# Patient Record
Sex: Male | Born: 1950 | Race: White | Hispanic: No | Smoking: Never smoker
Health system: Southern US, Community
[De-identification: ages and names within clinical notes are randomized; demographics above are authoritative.]

## PROBLEM LIST (undated history)

## (undated) DIAGNOSIS — I2510 Atherosclerotic heart disease of native coronary artery without angina pectoris: Secondary | ICD-10-CM

## (undated) DIAGNOSIS — R079 Chest pain, unspecified: Secondary | ICD-10-CM

## (undated) DIAGNOSIS — N289 Disorder of kidney and ureter, unspecified: Secondary | ICD-10-CM

## (undated) HISTORY — PX: CARDIAC CATHETERIZATION: SHX172

## (undated) HISTORY — PX: SPHENOIDECTOMY: SHX2421

## (undated) HISTORY — DX: Chest pain, unspecified: R07.9

## (undated) HISTORY — DX: Atherosclerotic heart disease of native coronary artery without angina pectoris: I25.10

---

## 2017-01-05 ENCOUNTER — Emergency Department (HOSPITAL_COMMUNITY)
Admission: EM | Admit: 2017-01-05 | Discharge: 2017-01-05 | Disposition: A | Discharge status: Home / Self Care | Payer: Medicare Other | Attending: Emergency Medicine | Admitting: Emergency Medicine

## 2017-01-05 ENCOUNTER — Encounter (HOSPITAL_COMMUNITY): Payer: Self-pay | Admitting: *Deleted

## 2017-01-05 ENCOUNTER — Emergency Department (HOSPITAL_COMMUNITY): Payer: Medicare Other

## 2017-01-05 DIAGNOSIS — R072 Precordial pain: Secondary | ICD-10-CM | POA: Diagnosis present

## 2017-01-05 DIAGNOSIS — Z7982 Long term (current) use of aspirin: Secondary | ICD-10-CM | POA: Diagnosis not present

## 2017-01-05 DIAGNOSIS — R079 Chest pain, unspecified: Secondary | ICD-10-CM

## 2017-01-05 DIAGNOSIS — I200 Unstable angina: Secondary | ICD-10-CM | POA: Insufficient documentation

## 2017-01-05 HISTORY — DX: Disorder of kidney and ureter, unspecified: N28.9

## 2017-01-05 LAB — BASIC METABOLIC PANEL
ANION GAP: 9 (ref 5–15)
BUN: 12 mg/dL (ref 6–20)
CALCIUM: 9.8 mg/dL (ref 8.9–10.3)
CHLORIDE: 107 mmol/L (ref 101–111)
CO2: 23 mmol/L (ref 22–32)
Creatinine, Ser: 0.99 mg/dL (ref 0.61–1.24)
GFR calc non Af Amer: >60 mL/min (ref >60)
GLUCOSE: 117 mg/dL — AB (ref 65–99)
POTASSIUM: 3.9 mmol/L (ref 3.5–5.1)
Sodium: 139 mmol/L (ref 135–145)

## 2017-01-05 LAB — I-STAT TROPONIN, ED
TROPONIN I, POC: 0.05 ng/mL (ref 0.00–0.08)
Troponin i, poc: 0.07 ng/mL (ref 0.00–0.08)

## 2017-01-05 LAB — CBC
HEMATOCRIT: 44.1 % (ref 39.0–52.0)
HEMOGLOBIN: 14.9 g/dL (ref 13.0–17.0)
MCH: 31.1 pg (ref 26.0–34.0)
MCHC: 33.8 g/dL (ref 30.0–36.0)
MCV: 92.1 fL (ref 78.0–100.0)
Platelets: 209 10*3/uL (ref 150–400)
RBC: 4.79 MIL/uL (ref 4.22–5.81)
RDW: 12.9 % (ref 11.5–15.5)
WBC: 6.3 10*3/uL (ref 4.0–10.5)

## 2017-01-05 MED ORDER — CARVEDILOL 6.25 MG PO TABS: 6.2500 mg | ORAL_TABLET | Freq: Two times a day (BID) | ORAL | 0 refills | Status: DC

## 2017-01-05 MED ORDER — ATORVASTATIN CALCIUM 40 MG PO TABS: 40.0000 mg | ORAL_TABLET | Freq: Every day | ORAL | 0 refills | Status: DC

## 2017-01-05 MED ORDER — NITROGLYCERIN 0.4 MG SL SUBL: 0.4000 mg | SUBLINGUAL_TABLET | SUBLINGUAL | 0 refills | Status: DC | PRN

## 2017-01-05 NOTE — Consult Note (Signed)
Chief Complaint/Reason for Consult: chest pain   Requesting Physician: ED   PCP:  No primary care provider on file. Primary Cardiologist:N/A  HPI:   66 YO male with history of HLD, HTN in town for his sister's funeral with chest pain. Originally from [place], lives now in [place].    He has noticed progressive chest pressure with exertion over the past 2 weeks which has been progressive and resolved with rest.  He previously saw urgent care for this and was referred to [facility] Roderic Ovens Al-Khori) who has set him up for an outpatient exercise treadmill stress.  Today, he was "discussing a sensitive subject" with his family and then while having a bowel movement and straining had chest pain, diaphoresis, and radiation down his left arm.  He took 2 325 ASA and presented to the ED.  ECG abnormal with ST depressions V3.  1st troponin negative.  No shortness of breath.  Previous smoker.  No significant ETOH use.  No early family history of CAD.  Previously took Lipitor (lipids added-on, non-fasting).  Denies other significant PMHx other than psoriasis.  Born with 1 kidney.   Past Medical History:  Diagnosis Date  . Renal disorder    born with 1 funcitoning kidney    Past Surgical History:  Procedure Laterality Date  . SPHENOIDECTOMY     No early CAD  Social History:  reports that he last smoked as a teenager. He has never used smokeless tobacco. He reports that he does not drink alcohol.   Allergies:  Allergies  Allergen Reactions  . Halibut Liver Oil [Fish Oil] Other (See Comments)    Halibut fish causes runny nose  . Shrimp [Shellfish Allergy] Other (See Comments)    Causes runny nose    MEDS: 81 mg ASA  Results for orders placed or performed during the hospital encounter of 01/05/17 (from the past 48 hour(s))  Basic metabolic panel     Status: Abnormal   Collection Time: 01/05/17  3:02 PM  Result Value Ref Range   Sodium 139 135 - 145 mmol/L   Potassium 3.9 3.5 - 5.1 mmol/L   Chloride 107 101 - 111 mmol/L   CO2 23 22 - 32 mmol/L   Glucose, Bld 117 (H) 65 - 99 mg/dL   BUN 12 6 - 20 mg/dL   Creatinine, Ser 0.99 0.61 - 1.24 mg/dL   Calcium 9.8 8.9 - 10.3 mg/dL   GFR calc non Af Amer >60 >60 mL/min   GFR calc Af Amer >60 >60 mL/min    Comment: (NOTE) The eGFR has been calculated using the CKD EPI equation. This calculation has not been validated in all clinical situations. eGFR's persistently <60 mL/min signify possible Chronic Kidney Disease.    Anion gap 9 5 - 15  CBC     Status: None   Collection Time: 01/05/17  3:02 PM  Result Value Ref Range   WBC 6.3 4.0 - 10.5 K/uL   RBC 4.79 4.22 - 5.81 MIL/uL   Hemoglobin 14.9 13.0 - 17.0 g/dL   HCT 44.1 39.0 - 52.0 %   MCV 92.1 78.0 - 100.0 fL   MCH 31.1 26.0 - 34.0 pg   MCHC 33.8 30.0 - 36.0 g/dL   RDW 12.9 11.5 - 15.5 %   Platelets 209 150 - 400 K/uL  I-stat troponin, ED     Status: None   Collection Time: 01/05/17  3:17 PM  Result Value Ref Range   Troponin  i, poc 0.05 0.00 - 0.08 ng/mL   Comment 3            Comment: Due to the release kinetics of cTnI, a negative result within the first hours of the onset of symptoms does not rule out myocardial infarction with certainty. If myocardial infarction is still suspected, repeat the test at appropriate intervals.    Dg Chest 2 View  Result Date: 01/05/2017 CLINICAL DATA:  Shortness of breath for 3 weeks. Midsternal chest pain earlier today with left arm pain and tightness. EXAM: CHEST  2 VIEW COMPARISON:  None. FINDINGS: The heart size and mediastinal contours are normal. The lungs are clear. There is no pleural effusion or pneumothorax. No acute osseous findings are identified. EKG snaps overlie the chest. IMPRESSION: No active cardiopulmonary process. Electronically Signed   By: Richardean Sale M.D.   On: 01/05/2017 15:40    ECG/TELE: NSR with subtle ST changes V3, V4  ROS: As above. Otherwise, review of systems is  negative unless per above HPI  Vitals:   01/05/17 1751 01/05/17 1800 01/05/17 1830 01/05/17 1900  BP: 147/82 174/83  172/84  Pulse: 74 86 75 78  Resp: _0 Temp:      TempSrc:      SpO2: 98% 98% 97% 98%  Weight:      Height:       Wt Readings from Last 10 Encounters:  01/05/17 99.8 kg (220 lb)    PE:  General: No acute distress HEENT: Atraumatic, EOMI, mucous membranes moist CV: RRR 2/6 SEM, gallops. No JVD at 45 degrees. No HJR. Respiratory: Clear, no crackles. Normal work of breathing ABD: Non-distended and non-tender. No palpable organomegaly.  Extremities: 2+ radial pulses bilaterally. No edema. Neuro/Psych: CN grossly intact, alert and oriented  Assessment/Plan Chest Pain, progressive stable angina with episode at rest after emotional stress, concerning for unstable features with ECG changes HTN HLD Systolic Ejection Murmur  Advised patient on recommendations for overnight observation admission with lab draws and consideration for ischemic evaluation in the morning.  Tomorrow is a celebration of life for his sister and he is opposed to staying.  He agreed to another blood draw and if troponin is elevated he agrees to stay.  He already has an outpatient exercise treadmill through [facility] but he would like to transfer his care to [facility].    Recommendations: - Overnight observation admission with serial troponin - TTE - Aspirin 81 mg daily, Coreg 6.25 mg BID, Atorvastatin 40 mg daily, Sublingual Nitroglycerine - If patient leaves, I will inform morning cardiology team of need for urgent clinic visit with consideration for ischemic evaluation.   - Patient knows strict return precautions if recurrent angina or symptoms - Discussed case with ED MD  Patient contact information : [phone] [facility]): [phone]  Lolita Cram Means  MD 01/05/2017, 7:56 PM

## 2017-01-05 NOTE — ED Notes (Signed)
Pt and femal visitor verbaloize understanding of d/c instructions including new prescriptions and PRN usage of nitro. Pt also understands to return with any new CP

## 2017-01-05 NOTE — ED Notes (Signed)
MD at bedside. 

## 2017-01-05 NOTE — ED Triage Notes (Addendum)
PT states intermittent sob and chest pain on exertion  for 2 weeks, each episode lasting 3 min.  However, today episode lasted for 20 min and radiates to L arm.  Pt swallowed 2 325 mg asa and then chewed 1 325 asa.

## 2017-01-05 NOTE — ED Notes (Signed)
Cardiology at bedside.

## 2017-01-05 NOTE — ED Provider Notes (Signed)
[facility]-EMERGENCY DEPT Provider Note   CSN: [id number] Arrival date & time: 01/05/17  1454     History   Chief Complaint Chief Complaint  Patient presents with  . Chest Pain  . Shortness of Breath    HPI [name] is a 66 y.o. male.  The history is provided by the patient.  Chest Pain   This is a new problem. The current episode started more than 1 week ago. The problem occurs every several days. The problem has been gradually worsening. The pain is associated with exertion. The pain is present in the substernal region. The pain is mild. The quality of the pain is described as exertional and pressure-like. The pain radiates to the left arm. Duration of episode(s) is 20 minutes. The symptoms are aggravated by exertion. Associated symptoms include diaphoresis, palpitations and shortness of breath. Pertinent negatives include no abdominal pain, no back pain, no cough, no fever and no vomiting. He has tried rest for the symptoms. The treatment provided mild relief. Risk factors include male gender, stress and obesity.  Pertinent negatives for past medical history include no seizures.    Past Medical History:  Diagnosis Date  . Renal disorder    born with 1 funcitoning kidney    There are no active problems to display for this patient.   Past Surgical History:  Procedure Laterality Date  . SPHENOIDECTOMY         Home Medications    Prior to Admission medications   Medication Sig Start Date End Date Taking? Authorizing Provider  aspirin EC 81 MG tablet Take 81 mg by mouth daily.   Yes Historical Provider, MD  cetirizine (ZYRTEC) 10 MG tablet Take 10 mg by mouth daily.   Yes Historical Provider, MD  Cholecalciferol (VITAMIN D-3) 5000 units TABS Take 1,500 Units by mouth daily.   Yes Historical Provider, MD  Multiple Vitamin (MULTIVITAMIN WITH MINERALS) TABS tablet Take 1 tablet by mouth daily.   Yes Historical Provider, MD  naproxen sodium (ALEVE) 220 MG tablet  Take 220 mg by mouth daily as needed (knee pain).   Yes Historical Provider, MD  Omega-3 Fatty Acids (FISH OIL TRIPLE STRENGTH) 1400 MG CAPS Take 1,400 mg by mouth daily.   Yes Historical Provider, MD  atorvastatin (LIPITOR) 40 MG tablet Take 1 tablet (40 mg total) by mouth daily at 6 PM. 01/05/17 02/04/17  Lennette Bihari, MD  carvedilol (COREG) 6.25 MG tablet Take 1 tablet (6.25 mg total) by mouth 2 (two) times daily with a meal. 01/05/17 02/04/17  Lennette Bihari, MD  nitroGLYCERIN (NITROSTAT) 0.4 MG SL tablet Place 1 tablet (0.4 mg total) under the tongue every 5 (five) minutes as needed for chest pain. 01/05/17   Lennette Bihari, MD    Family History No family history on file.  Social History Social History  Substance Use Topics  . Smoking status: Never Smoker  . Smokeless tobacco: Never Used  . Alcohol use No     Allergies   Halibut liver oil [fish oil] and Shrimp [shellfish allergy]   Review of Systems Review of Systems  Constitutional: Positive for diaphoresis. Negative for chills and fever.  HENT: Negative for ear pain and sore throat.   Eyes: Negative for pain and visual disturbance.  Respiratory: Positive for shortness of breath. Negative for cough.   Cardiovascular: Positive for chest pain and palpitations.  Gastrointestinal: Negative for abdominal pain and vomiting.  Genitourinary: Negative for dysuria and hematuria.  Musculoskeletal: Negative for  arthralgias and back pain.  Skin: Negative for color change and rash.  Neurological: Negative for seizures and syncope.  All other systems reviewed and are negative.    Physical Exam Updated Vital Signs BP 141/93   Pulse 70   Temp 99.3 F (37.4 C) (Oral)   Resp 11   Ht 6\' 2"  (1.88 m)   Wt 99.8 kg   SpO2 97%   BMI 28.25 kg/m   Physical Exam  Constitutional: He appears well-developed and well-nourished.  HENT:  Head: Normocephalic and atraumatic.  Eyes: Conjunctivae are normal.  Neck: Neck supple.    Cardiovascular: Normal rate and regular rhythm.  Exam reveals no gallop and no friction rub.   No murmur heard. Pulmonary/Chest: Effort normal and breath sounds normal. No respiratory distress. He has no wheezes. He has no rales.  Abdominal: Soft. There is no tenderness.  Musculoskeletal: He exhibits no edema, tenderness or deformity.  Neurological: He is alert.  Skin: Skin is warm and dry.  Psychiatric: He has a normal mood and affect.  Nursing note and vitals reviewed.    ED Treatments / Results  Labs (all labs ordered are listed, but only abnormal results are displayed) Labs Reviewed  BASIC METABOLIC PANEL - Abnormal; Notable for the following:       Result Value   Glucose, Bld 117 (*)    All other components within normal limits  CBC  LIPID PANEL  TSH  HEMOGLOBIN A1C  I-STAT TROPOININ, ED  Rosezena SensorI-STAT TROPOININ, ED    EKG  EKG Interpretation None       Radiology Dg Chest 2 View  Result Date: 01/05/2017 CLINICAL DATA:  Shortness of breath for 3 weeks. Midsternal chest pain earlier today with left arm pain and tightness. EXAM: CHEST  2 VIEW COMPARISON:  None. FINDINGS: The heart size and mediastinal contours are normal. The lungs are clear. There is no pleural effusion or pneumothorax. No acute osseous findings are identified. EKG snaps overlie the chest. IMPRESSION: No active cardiopulmonary process. Electronically Signed   By: Carey BullocksWilliam  Veazey M.D.   On: 01/05/2017 15:40    Procedures Procedures (including critical care time)  Medications Ordered in ED Medications - No data to display   Initial Impression / Assessment and Plan / ED Course  I have reviewed the triage vital signs and the nursing notes.  Pertinent labs & imaging results that were available during my care of the patient were reviewed by me and considered in my medical decision making (see chart for details).    Patient is a 66 year old male with medical history, although he has not seen a physician  in many years and thinks he may have high cholesterol, who presents with chest pain. [place] and has been here since january. He is in the process of closing out his sister's estate. He has noticed several episodes of exertional chest pain over the last few weeks that are becoming more frequent and lasting longer. Initially, his episodes would resolve immediately upon resting. They were infrequent and unpredictable. However, over the last 2 days, he has had pain with mild exertion which lasted 10-20 minutes after resting. Today, he had an episode several hours prior to arrival which was also associated with diaphoresis and shortness of breath. After one of his recent episodes, he saw a local cardiologist who has scheduled him for a stress test on 01/16/2017. However, patient states he would rather see cardiology here. He is pain-free in the ED. He  took aspirin prior to arrival. His EKG is notable for ST depression in V2 through V4. Initial troponin negative. We recommended inpatient workup of his chest pain. However, patient states that his sister's memorial services tomorrow and he is not willing to stay in the hospital tonight given that his pain is resolved. We consulted her cardiologist is seen the patient in the ED. They will attempt to arrange for close outpatient follow-up and likely stress testing. Patient is able to express the risks of leaving without further workup. He did agree to stay for delta troponin which was negative. He remained pain-free throughout his time in the ED. Return precautions were discussed in detail. He was started on aspirin, Lipitor, Coreg, and sublingual nitroglycerin. Patient discharged in stable condition.  Final Clinical Impressions(s) / ED Diagnoses   Final diagnoses:  Unstable angina [facility])    New Prescriptions Discharge Medication List as of 01/05/2017  9:12 PM    START taking these medications   Details  atorvastatin (LIPITOR) 40 MG  tablet Take 1 tablet (40 mg total) by mouth daily at 6 PM., Starting Sat 01/05/2017, Until Mon 02/04/2017, Print    carvedilol (COREG) 6.25 MG tablet Take 1 tablet (6.25 mg total) by mouth 2 (two) times daily with a meal., Starting Sat 01/05/2017, Until Mon 02/04/2017, Print    nitroGLYCERIN (NITROSTAT) 0.4 MG SL tablet Place 1 tablet (0.4 mg total) under the tongue every 5 (five) minutes as needed for chest pain., Starting Sat 01/05/2017, Print         Lennette Bihari, MD 01/06/17 5638    Geoffery Lyons, MD 01/06/17 1537

## 2017-01-07 ENCOUNTER — Encounter: Payer: Self-pay | Admitting: Cardiology

## 2017-01-08 ENCOUNTER — Encounter: Payer: Self-pay | Admitting: *Deleted

## 2017-01-08 ENCOUNTER — Encounter: Payer: Self-pay | Admitting: Cardiology

## 2017-01-08 ENCOUNTER — Encounter (INDEPENDENT_AMBULATORY_CARE_PROVIDER_SITE_OTHER): Payer: Self-pay

## 2017-01-08 ENCOUNTER — Ambulatory Visit (INDEPENDENT_AMBULATORY_CARE_PROVIDER_SITE_OTHER): Payer: Medicare Other | Admitting: Cardiology

## 2017-01-08 VITALS — BP 118/80 | HR 68 | Ht 74.0 in | Wt 220.4 lb

## 2017-01-08 DIAGNOSIS — L409 Psoriasis, unspecified: Secondary | ICD-10-CM

## 2017-01-08 DIAGNOSIS — Z01812 Encounter for preprocedural laboratory examination: Secondary | ICD-10-CM

## 2017-01-08 DIAGNOSIS — I200 Unstable angina: Secondary | ICD-10-CM | POA: Diagnosis not present

## 2017-01-08 DIAGNOSIS — R079 Chest pain, unspecified: Secondary | ICD-10-CM

## 2017-01-08 LAB — BASIC METABOLIC PANEL
BUN/Creatinine Ratio: 13 (ref 10–24)
BUN: 13 mg/dL (ref 8–27)
CO2: 24 mmol/L (ref 18–29)
CREATININE: 0.97 mg/dL (ref 0.76–1.27)
Calcium: 10.3 mg/dL — ABNORMAL HIGH (ref 8.6–10.2)
Chloride: 100 mmol/L (ref 96–106)
GFR calc Af Amer: 93 (ref >59)
GFR, EST NON AFRICAN AMERICAN: 80 (ref >59)
Glucose: 96 mg/dL (ref 65–99)
POTASSIUM: 4.7 mmol/L (ref 3.5–5.2)
SODIUM: 142 mmol/L (ref 134–144)

## 2017-01-08 LAB — PROTIME-INR
INR: 1 (ref 0.8–1.2)
PROTHROMBIN TIME: 10.8 s (ref 9.1–12.0)

## 2017-01-08 LAB — CBC
HEMATOCRIT: 41.7 % (ref 37.5–51.0)
Hemoglobin: 14.2 g/dL (ref 13.0–17.7)
MCH: 30.9 pg (ref 26.6–33.0)
MCHC: 34.1 g/dL (ref 31.5–35.7)
MCV: 91 fL (ref 79–97)
Platelets: 231 10*3/uL (ref 150–379)
RBC: 4.6 x10E6/uL (ref 4.14–5.80)
RDW: 13.4 % (ref 12.3–15.4)
WBC: 7.3 10*3/uL (ref 3.4–10.8)

## 2017-01-08 NOTE — Patient Instructions (Signed)
Medication Instructions:  The current medical regimen is effective;  continue present plan and medications.  Labwork: Please have blood work today (BMP, CBC and INR/PT)  Testing/Procedures: Your physician has requested that you have a cardiac catheterization. Cardiac catheterization is used to diagnose and/or treat various heart conditions. Doctors may recommend this procedure for a number of different reasons. The most common reason is to evaluate chest pain. Chest pain can be a symptom of coronary artery disease (CAD), and cardiac catheterization can show whether plaque is narrowing or blocking your heart's arteries. This procedure is also used to evaluate the valves, as well as measure the blood flow and oxygen levels in different parts of your heart. For further information please visit [email or web address]. Please follow instruction sheet, as given.  Follow-Up: Follow up approximately 2 weeks after your cardiac cath.  Thank you for choosing [facility]!!

## 2017-01-08 NOTE — Progress Notes (Signed)
 Cardiology Office Note    Date:  01/08/2017   ID:  [name], DOB 10/08/1951, MRN [id number]  PCP:  No PCP Per Patient  Cardiologist:   Lenford Beddow, MD     History of Present Illness:  [name] is a 65 y.o. male here for evaluation of chest pain. Seen by Dr. Means who recommended staying however he decided to leave.  To quote Dr. Means: 65 year old male with history of HLD, HTN in town for his sister's funeral with chest pain. Originally from [place], lives now in [place]    He has noticed progressive chest pressure with exertion over the past 2 weeks which has been progressive and resolved with rest.  He previously saw urgent care for this and was referred to [facility] on 12/25/16 (Dr. Fareed Al-Khori) who has set him up for an outpatient exercise treadmill stress.  Today, he was "discussing a sensitive subject" with his family and then while having a bowel movement and straining had chest pain, diaphoresis, and radiation down his left arm.  He took 2 325 ASA and presented to the ED.  ECG abnormal with ST depressions V3.  1st troponin negative.  No shortness of breath.  Previous smoker.  No significant ETOH use.  No early family history of CAD.  Previously took Lipitor (lipids added-on, non-fasting).  Denies other significant PMHx other than psoriasis.  Born with 1 kidney.   Prior chest pain was mainly with exertion, lifting heavy objects, pushing heavy carts.   He had stress test in 1990's and 2004 - normal.   Yesterday he states he did not have any chest pain and on Sunday they celebrated his sister's life. His sister died in August 2017 after car accident. He moved down here from [place] to help settle her estate. 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 On 12/11/16 when lifting a heavy box, he felt a funny feeling in his chest. He's felt a proximally doesn't times since then. Bridget is his significant other who is in  [place]. He also has a daughter.  The most concerning episode happened a few days ago when [place] when he began to feel shortness of breath, significant chest pain, left arm pain and diaphoresis that lasted approximate 20 minutes relieved with rest. He said other episodes that are similar to this but less intense.   Oct. Fishing [place] fishing, mild altitude related SOB. No CP.   No DM, No CAD. Mother died 99 CHF. Bb, statin just started. No physical exam in 7 years.   Past Medical History:  Diagnosis Date  . Renal disorder    born with 1 funcitoning kidney    Past Surgical History:  Procedure Laterality Date  . SPHENOIDECTOMY      Current Medications: Outpatient Medications Prior to Visit  Medication Sig Dispense Refill  . aspirin EC 81 MG tablet Take 81 mg by mouth daily.    . atorvastatin (LIPITOR) 40 MG tablet Take 1 tablet (40 mg total) by mouth daily at 6 PM. 30 tablet 0  . carvedilol (COREG) 6.25 MG tablet Take 1 tablet (6.25 mg total) by mouth 2 (two) times daily with a meal. 60 tablet 0  . cetirizine (ZYRTEC) 10 MG tablet Take 10 mg by mouth daily.    . Cholecalciferol (VITAMIN D-3) 5000 units TABS Take 1,500 Units by mouth daily.    . Multiple Vitamin (MULTIVITAMIN WITH MINERALS) TABS   tablet Take 1 tablet by mouth daily.    . naproxen sodium (ALEVE) 220 MG tablet Take 220 mg by mouth daily as needed (knee pain).    . nitroGLYCERIN (NITROSTAT) 0.4 MG SL tablet Place 1 tablet (0.4 mg total) under the tongue every 5 (five) minutes as needed for chest pain. 30 tablet 0  . Omega-3 Fatty Acids (FISH OIL TRIPLE STRENGTH) 1400 MG CAPS Take 1,400 mg by mouth daily.     No facility-administered medications prior to visit.      Allergies:   Halibut liver oil [fish oil] and Shrimp [shellfish allergy]   Social History   Social History  . Marital status: Divorced    Spouse name: N/A  . Number of children: N/A  . Years of education: N/A    Social History Main Topics  . Smoking status: Never Smoker  . Smokeless tobacco: Never Used  . Alcohol use No  . Drug use: No  . Sexual activity: Not Asked   Other Topics Concern  . None   Social History Narrative  . None     Family History:  No early CAD   ROS:   Please see the history of present illness.    ROS All other systems reviewed and are negative.   PHYSICAL EXAM:   VS:  BP 118/80 (BP Location: Right Arm)   Pulse 68   Ht 6' 2" (1.88 m)   Wt 220 lb 6.4 oz (100 kg)   BMI 28.30 kg/m    GEN: Well nourished, well developed, in no acute distress  HEENT: normal  Neck: no JVD, carotid bruits, or masses Cardiac: RRR; no murmurs, rubs, or gallops,no edema  Respiratory:  clear to auscultation bilaterally, normal work of breathing GI: soft, nontender, nondistended, + BS MS: no deformity or atrophy  Skin: warm and dry, no rash Neuro:  Alert and Oriented x 3, Strength and sensation are intact Psych: euthymic mood, full affect  Wt Readings from Last 3 Encounters:  01/08/17 220 lb 6.4 oz (100 kg)  01/05/17 220 lb (99.8 kg)      Studies/Labs Reviewed:   EKG:  01/05/17 - NSR with very subtle ST depression (NSSTW changes) V3-4, Personally viewed. EKG from 12/12/16 shows heart rate 90, PACs, nonspecific ST-T wave changes.  Recent Labs: 01/05/2017: BUN 12; Creatinine, Ser 0.99; Hemoglobin 14.9; Platelets 209; Potassium 3.9; Sodium 139   Lipid Panel No results found for: CHOL, TRIG, HDL, CHOLHDL, VLDL, LDLCALC, LDLDIRECT  Additional studies/ records that were reviewed today include:  CXR - WNL Trop normal ER visit notes reviewed.    ASSESSMENT:    1. Unstable angina (HCC)   2. Chest pain, unspecified type   3. Pre-procedure lab exam   4. Psoriasis      PLAN:  In order of problems listed above:  Unstable angina  - ER visit 01/05/17 - trop x 2 normal, ECG with NSSTW changes  - No DM, no FHX, no prior CAD  - He has had multiple episodes that seem to be  escalating, increasing. [place]st episode [place] developing central chest pain with left arm radiation and diaphoresis that relieved itself after 20 minutes. Concerning symptom. He is worried about this.  - We'll proceed with left heart catheterization, radial artery approach. Risks of stroke, MI, death, renal impairment, bleeding discussed. Normal creatinine 0.99, solitary kidney, hydration. He has a shellfish allergy listed however he states that he has "nasal discharge ", runny nose with   this and has never had any trouble breathing or hives or rash.  - Continue with beta blocker and statin for now.  Multiple allergies  - list reviewed  - We discussed shrimp allergy - he said that he only develops a runny nose occasionally when eating shrimp. He has never developed any trouble breathing, no hives or any anaphylactic reaction. I asked him to take a Benadryl and a Pepcid the morning of the procedure. I will not prescribe prednisone prior.  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 Creatinine 0.9    Medication Adjustments/Labs and Tests Ordered: Current medicines are reviewed at length with the patient today.  Concerns regarding medicines are outlined above.  Medication changes, Labs and Tests ordered today are listed in the Patient Instructions below. Patient Instructions  Medication Instructions:  The current medical regimen is effective;  continue present plan and medications.  Labwork: Please have blood work today (BMP, CBC and INR/PT)  Testing/Procedures: Your physician has requested that you have a cardiac catheterization. Cardiac catheterization is used to diagnose and/or treat various heart conditions. Doctors may recommend this procedure for a number of different reasons. The most common reason is to evaluate chest pain. Chest pain can be a symptom of coronary artery disease (CAD), and cardiac catheterization  can show whether plaque is narrowing or blocking your heart's arteries. This procedure is also used to evaluate the valves, as well as measure the blood flow and oxygen levels in different parts of your heart. For further information please visit www.[email or web address]. Please follow instruction sheet, as given.  Follow-Up: Follow up approximately 2 weeks after your cardiac cath.  Thank you for choosing [facility]!!         Signed, Alyson Ki, MD  01/08/2017 9:14 AM    [facility] [address], [place], [place]  [address] Phone: [phone]; Fax: [phone]  

## 2017-01-09 ENCOUNTER — Encounter (HOSPITAL_COMMUNITY)
Admission: RE | Disposition: A | Discharge status: Home / Self Care | Payer: Self-pay | Source: Ambulatory Visit | Attending: Cardiothoracic Surgery

## 2017-01-09 ENCOUNTER — Other Ambulatory Visit: Payer: Self-pay | Admitting: *Deleted

## 2017-01-09 ENCOUNTER — Encounter (HOSPITAL_COMMUNITY): Payer: Self-pay | Admitting: Interventional Cardiology

## 2017-01-09 ENCOUNTER — Inpatient Hospital Stay (HOSPITAL_COMMUNITY)
Admission: RE | Admit: 2017-01-09 | Discharge: 2017-01-16 | DRG: 234 | Disposition: A | Discharge status: Home / Self Care | Payer: Medicare Other | Source: Ambulatory Visit | Attending: Cardiothoracic Surgery | Admitting: Cardiothoracic Surgery

## 2017-01-09 ENCOUNTER — Encounter (HOSPITAL_COMMUNITY): Payer: Medicare Other

## 2017-01-09 ENCOUNTER — Inpatient Hospital Stay (HOSPITAL_COMMUNITY): Payer: Medicare Other

## 2017-01-09 DIAGNOSIS — I2510 Atherosclerotic heart disease of native coronary artery without angina pectoris: Secondary | ICD-10-CM

## 2017-01-09 DIAGNOSIS — Z7982 Long term (current) use of aspirin: Secondary | ICD-10-CM | POA: Diagnosis not present

## 2017-01-09 DIAGNOSIS — E559 Vitamin D deficiency, unspecified: Secondary | ICD-10-CM | POA: Diagnosis present

## 2017-01-09 DIAGNOSIS — Q600 Renal agenesis, unilateral: Secondary | ICD-10-CM

## 2017-01-09 DIAGNOSIS — Z951 Presence of aortocoronary bypass graft: Secondary | ICD-10-CM

## 2017-01-09 DIAGNOSIS — Z79899 Other long term (current) drug therapy: Secondary | ICD-10-CM

## 2017-01-09 DIAGNOSIS — D696 Thrombocytopenia, unspecified: Secondary | ICD-10-CM | POA: Diagnosis not present

## 2017-01-09 DIAGNOSIS — E785 Hyperlipidemia, unspecified: Secondary | ICD-10-CM

## 2017-01-09 DIAGNOSIS — L409 Psoriasis, unspecified: Secondary | ICD-10-CM | POA: Diagnosis present

## 2017-01-09 DIAGNOSIS — I249 Acute ischemic heart disease, unspecified: Secondary | ICD-10-CM | POA: Diagnosis present

## 2017-01-09 DIAGNOSIS — I10 Essential (primary) hypertension: Secondary | ICD-10-CM | POA: Diagnosis present

## 2017-01-09 DIAGNOSIS — I200 Unstable angina: Secondary | ICD-10-CM

## 2017-01-09 DIAGNOSIS — I25700 Atherosclerosis of coronary artery bypass graft(s), unspecified, with unstable angina pectoris: Secondary | ICD-10-CM

## 2017-01-09 DIAGNOSIS — I25110 Atherosclerotic heart disease of native coronary artery with unstable angina pectoris: Secondary | ICD-10-CM | POA: Diagnosis present

## 2017-01-09 DIAGNOSIS — R079 Chest pain, unspecified: Secondary | ICD-10-CM

## 2017-01-09 DIAGNOSIS — D62 Acute posthemorrhagic anemia: Secondary | ICD-10-CM | POA: Diagnosis not present

## 2017-01-09 DIAGNOSIS — N400 Enlarged prostate without lower urinary tract symptoms: Secondary | ICD-10-CM | POA: Diagnosis present

## 2017-01-09 DIAGNOSIS — Z09 Encounter for follow-up examination after completed treatment for conditions other than malignant neoplasm: Secondary | ICD-10-CM

## 2017-01-09 DIAGNOSIS — R7989 Other specified abnormal findings of blood chemistry: Secondary | ICD-10-CM

## 2017-01-09 DIAGNOSIS — J939 Pneumothorax, unspecified: Secondary | ICD-10-CM

## 2017-01-09 DIAGNOSIS — I471 Supraventricular tachycardia: Secondary | ICD-10-CM | POA: Diagnosis not present

## 2017-01-09 DIAGNOSIS — E8770 Fluid overload, unspecified: Secondary | ICD-10-CM | POA: Diagnosis not present

## 2017-01-09 DIAGNOSIS — Z01810 Encounter for preprocedural cardiovascular examination: Secondary | ICD-10-CM | POA: Diagnosis not present

## 2017-01-09 DIAGNOSIS — Z87438 Personal history of other diseases of male genital organs: Secondary | ICD-10-CM

## 2017-01-09 DIAGNOSIS — N200 Calculus of kidney: Secondary | ICD-10-CM

## 2017-01-09 HISTORY — DX: Chest pain, unspecified: R07.9

## 2017-01-09 HISTORY — PX: LEFT HEART CATH AND CORONARY ANGIOGRAPHY: CATH118249

## 2017-01-09 LAB — PULMONARY FUNCTION TEST
FEF 25-75 Pre: 1.62 L/sec
FEF2575-%Pred-Pre: 54 %
FEV1-%Pred-Pre: 72 %
FEV1-Pre: 2.78 L
FEV1FVC-%Pred-Pre: 93 %
FEV6-%Pred-Pre: 76 %
FEV6-Pre: 3.79 L
FEV6FVC-%Pred-Pre: 99 %
FVC-%Pred-Pre: 77 %
FVC-Pre: 4.03 L
Pre FEV1/FVC ratio: 69 %
Pre FEV6/FVC Ratio: 94 %

## 2017-01-09 SURGERY — LEFT HEART CATH AND CORONARY ANGIOGRAPHY

## 2017-01-09 MED ORDER — NITROGLYCERIN 1 MG/10 ML FOR IR/CATH LAB
INTRA_ARTERIAL | Status: DC | PRN
  Administered 2017-01-09: 200 ug via INTRACORONARY

## 2017-01-09 MED ORDER — HEPARIN SODIUM (PORCINE) 1000 UNIT/ML IJ SOLN
INTRAMUSCULAR | Status: AC
  Filled 2017-01-09: qty 1

## 2017-01-09 MED ORDER — FENTANYL CITRATE (PF) 100 MCG/2ML IJ SOLN
INTRAMUSCULAR | Status: AC
Start: 2017-01-09 — End: 2017-01-09
  Filled 2017-01-09: qty 2

## 2017-01-09 MED ORDER — CARVEDILOL 6.25 MG PO TABS
6.2500 mg | ORAL_TABLET | Freq: Two times a day (BID) | ORAL | Status: DC
  Administered 2017-01-09 – 2017-01-10 (×3): 6.25 mg via ORAL
  Filled 2017-01-09 (×3): qty 1

## 2017-01-09 MED ORDER — ATORVASTATIN CALCIUM 80 MG PO TABS
80.0000 mg | ORAL_TABLET | Freq: Every day | ORAL | Status: DC
  Administered 2017-01-09 – 2017-01-15 (×6): 80 mg via ORAL
  Filled 2017-01-09 (×6): qty 1

## 2017-01-09 MED ORDER — ACETAMINOPHEN 325 MG PO TABS: 650.0000 mg | ORAL_TABLET | ORAL | Status: DC | PRN

## 2017-01-09 MED ORDER — SODIUM CHLORIDE 0.9% FLUSH
3.0000 mL | Freq: Two times a day (BID) | INTRAVENOUS | Status: DC
  Administered 2017-01-09 – 2017-01-10 (×3): 3 mL via INTRAVENOUS

## 2017-01-09 MED ORDER — MIDAZOLAM HCL 2 MG/2ML IJ SOLN
INTRAMUSCULAR | Status: DC | PRN
  Administered 2017-01-09: 1 mg via INTRAVENOUS

## 2017-01-09 MED ORDER — ENOXAPARIN SODIUM 100 MG/ML ~~LOC~~ SOLN
95.0000 mg | Freq: Two times a day (BID) | SUBCUTANEOUS | Status: DC
  Administered 2017-01-09: 95 mg via SUBCUTANEOUS
  Filled 2017-01-09: qty 1

## 2017-01-09 MED ORDER — SODIUM CHLORIDE 0.9% FLUSH: 3.0000 mL | INTRAVENOUS | Status: DC | PRN

## 2017-01-09 MED ORDER — IOPAMIDOL (ISOVUE-370) INJECTION 76%
INTRAVENOUS | Status: AC
  Filled 2017-01-09: qty 100

## 2017-01-09 MED ORDER — IOPAMIDOL (ISOVUE-370) INJECTION 76%
INTRAVENOUS | Status: DC | PRN
  Administered 2017-01-09: 105 mL via INTRA_ARTERIAL

## 2017-01-09 MED ORDER — NITROGLYCERIN 1 MG/10 ML FOR IR/CATH LAB
INTRA_ARTERIAL | Status: AC
  Filled 2017-01-09: qty 10

## 2017-01-09 MED ORDER — LORATADINE 10 MG PO TABS
10.0000 mg | ORAL_TABLET | Freq: Every day | ORAL | Status: DC
  Administered 2017-01-10 – 2017-01-12 (×2): 10 mg via ORAL
  Filled 2017-01-09 (×2): qty 1

## 2017-01-09 MED ORDER — VERAPAMIL HCL 2.5 MG/ML IV SOLN
INTRAVENOUS | Status: AC
  Filled 2017-01-09: qty 2

## 2017-01-09 MED ORDER — OXYCODONE-ACETAMINOPHEN 5-325 MG PO TABS: 1.0000 | ORAL_TABLET | ORAL | Status: DC | PRN

## 2017-01-09 MED ORDER — MIDAZOLAM HCL 2 MG/2ML IJ SOLN
INTRAMUSCULAR | Status: AC
  Filled 2017-01-09: qty 2

## 2017-01-09 MED ORDER — ASPIRIN 81 MG PO CHEW
81.0000 mg | CHEWABLE_TABLET | Freq: Every day | ORAL | Status: DC
  Administered 2017-01-10: 81 mg via ORAL
  Filled 2017-01-09: qty 1

## 2017-01-09 MED ORDER — LIDOCAINE HCL (PF) 1 % IJ SOLN
INTRAMUSCULAR | Status: AC
  Filled 2017-01-09: qty 30

## 2017-01-09 MED ORDER — LIDOCAINE HCL (PF) 1 % IJ SOLN
INTRAMUSCULAR | Status: DC | PRN
  Administered 2017-01-09: 2 mL

## 2017-01-09 MED ORDER — HEPARIN (PORCINE) IN NACL 2-0.9 UNIT/ML-% IJ SOLN
INTRAMUSCULAR | Status: AC
  Filled 2017-01-09: qty 1000

## 2017-01-09 MED ORDER — SODIUM CHLORIDE 0.9% FLUSH
3.0000 mL | Freq: Two times a day (BID) | INTRAVENOUS | Status: DC
  Administered 2017-01-09: 3 mL via INTRAVENOUS

## 2017-01-09 MED ORDER — VITAMIN D-3 125 MCG (5000 UT) PO TABS: 1500.0000 [IU] | ORAL_TABLET | Freq: Every day | ORAL | Status: DC

## 2017-01-09 MED ORDER — SODIUM CHLORIDE 0.9 % WEIGHT BASED INFUSION: 1.0000 mL/kg/h | INTRAVENOUS | Status: DC

## 2017-01-09 MED ORDER — ASPIRIN 81 MG PO CHEW: 81.0000 mg | CHEWABLE_TABLET | ORAL | Status: DC

## 2017-01-09 MED ORDER — ATORVASTATIN CALCIUM 40 MG PO TABS: 40.0000 mg | ORAL_TABLET | Freq: Every day | ORAL | Status: DC

## 2017-01-09 MED ORDER — HEPARIN (PORCINE) IN NACL 2-0.9 UNIT/ML-% IJ SOLN
INTRAMUSCULAR | Status: DC | PRN
  Administered 2017-01-09: 1000 mL

## 2017-01-09 MED ORDER — NITROGLYCERIN 0.4 MG SL SUBL: 0.4000 mg | SUBLINGUAL_TABLET | SUBLINGUAL | Status: DC | PRN

## 2017-01-09 MED ORDER — MORPHINE SULFATE (PF) 2 MG/ML IV SOLN: 2.0000 mg | INTRAVENOUS | Status: DC | PRN

## 2017-01-09 MED ORDER — SODIUM CHLORIDE 0.9 % WEIGHT BASED INFUSION
3.0000 mL/kg/h | INTRAVENOUS | Status: DC
  Administered 2017-01-09: 3 mL/kg/h via INTRAVENOUS

## 2017-01-09 MED ORDER — ONDANSETRON HCL 4 MG/2ML IJ SOLN: 4.0000 mg | Freq: Four times a day (QID) | INTRAMUSCULAR | Status: DC | PRN

## 2017-01-09 MED ORDER — FENTANYL CITRATE (PF) 100 MCG/2ML IJ SOLN
INTRAMUSCULAR | Status: DC | PRN
  Administered 2017-01-09: 50 ug via INTRAVENOUS

## 2017-01-09 MED ORDER — SODIUM CHLORIDE 0.9 % WEIGHT BASED INFUSION
1.0000 mL/kg/h | INTRAVENOUS | Status: AC
  Administered 2017-01-09: 1 mL/kg/h via INTRAVENOUS

## 2017-01-09 MED ORDER — SODIUM CHLORIDE 0.9 % IV SOLN: 250.0000 mL | INTRAVENOUS | Status: DC | PRN

## 2017-01-09 MED ORDER — ADULT MULTIVITAMIN W/MINERALS CH
1.0000 | ORAL_TABLET | Freq: Every day | ORAL | Status: DC
  Administered 2017-01-10 – 2017-01-12 (×2): 1 via ORAL
  Filled 2017-01-09 (×2): qty 1

## 2017-01-09 MED ORDER — SODIUM CHLORIDE 0.9 % IV SOLN
250.0000 mL | INTRAVENOUS | Status: DC | PRN
Start: 2017-01-09 — End: 2017-01-11
  Administered 2017-01-11: 08:00:00 via INTRAVENOUS

## 2017-01-09 MED ORDER — HEPARIN (PORCINE) IN NACL 2-0.9 UNIT/ML-% IJ SOLN
INTRAMUSCULAR | Status: DC | PRN
  Administered 2017-01-09: 12:00:00 via INTRA_ARTERIAL

## 2017-01-09 MED ORDER — ASPIRIN EC 81 MG PO TBEC: 81.0000 mg | DELAYED_RELEASE_TABLET | Freq: Every day | ORAL | Status: DC

## 2017-01-09 MED ORDER — HEPARIN SODIUM (PORCINE) 1000 UNIT/ML IJ SOLN
INTRAMUSCULAR | Status: DC | PRN
  Administered 2017-01-09: 5000 [IU] via INTRAVENOUS

## 2017-01-09 SURGICAL SUPPLY — 12 items
CATH INFINITI 5 FR JL3.5 (CATHETERS) ×3 IMPLANT
CATH INFINITI JR4 5F (CATHETERS) ×3 IMPLANT
CATH LAUNCHER 5F EBU3.5 (CATHETERS) ×3 IMPLANT
DEVICE RAD COMP TR BAND LRG (VASCULAR PRODUCTS) ×3 IMPLANT
GLIDESHEATH SLEND A-KIT 6F 22G (SHEATH) ×3 IMPLANT
GUIDEWIRE INQWIRE 1.5J.035X260 (WIRE) ×1 IMPLANT
INQWIRE 1.5J .035X260CM (WIRE) ×3
KIT ENCORE 26 ADVANTAGE (KITS) IMPLANT
KIT HEART LEFT (KITS) ×3 IMPLANT
PACK CARDIAC CATHETERIZATION (CUSTOM PROCEDURE TRAY) ×3 IMPLANT
TRANSDUCER W/STOPCOCK (MISCELLANEOUS) ×3 IMPLANT
TUBING CIL FLEX 10 FLL-RA (TUBING) ×3 IMPLANT

## 2017-01-09 NOTE — H&P (View-Only) (Signed)
Cardiology Office Note    Date:  01/08/2017   ID:  [name], DOB 12-29-1950, MRN [id number]  PCP:  No PCP Per Patient  Cardiologist:   Tyler SchultzMark Skains, MD     History of Present Illness:  [name] is a 66 y.o. male here for evaluation of chest pain. Seen by Dr. Charlestine Cole who recommended staying however he decided to leave.  To quote Dr. Charlestine Cole: 66 year old male with history of HLD, HTN in town for his sister's funeral with chest pain. Originally from [place], lives now in [place]    He has noticed progressive chest pressure with exertion over the past 2 weeks which has been progressive and resolved with rest.  He previously saw urgent care for this and was referred to [facility] on 12/25/16 (Dr. Vick FreesFareed Cole) who has set him up for an outpatient exercise treadmill stress.  Today, he was "discussing a sensitive subject" with his family and then while having a bowel movement and straining had chest pain, diaphoresis, and radiation down his left arm.  He took 2 325 ASA and presented to the ED.  ECG abnormal with ST depressions V3.  1st troponin negative.  No shortness of breath.  Previous smoker.  No significant ETOH use.  No early family history of CAD.  Previously took Lipitor (lipids added-on, non-fasting).  Denies other significant PMHx other than psoriasis.  Born with 1 kidney.   Prior chest pain was mainly with exertion, lifting heavy objects, pushing heavy carts.   He had stress test in 1990's and 2004 - normal.   Yesterday he states he did not have any chest pain and on Sunday they celebrated his sister's life. His sister died in August 2017 after car accident. He moved down here from [place] to help settle her estate. 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 On 12/11/16 when lifting a heavy box, he felt a funny feeling in his chest. He's felt a proximally doesn't times since then. [name] is his significant other who is in  [place]. He also has a daughter.  The most concerning episode happened a few days ago when [place] when he began to feel shortness of breath, significant chest pain, left arm pain and diaphoresis that lasted approximate 20 minutes relieved with rest. He said other episodes that are similar to this but less intense.   Oct. [place]ishing [place] fishing, mild altitude related SOB. No CP.   No DM, No CAD. Mother died 499 CHF. Bb, statin just started. No physical exam in 7 years.   Past Medical History:  Diagnosis Date  . Renal disorder    born with 1 funcitoning kidney    Past Surgical History:  Procedure Laterality Date  . SPHENOIDECTOMY      Current Medications: Outpatient Medications Prior to Visit  Medication Sig Dispense Refill  . aspirin EC 81 MG tablet Take 81 mg by mouth daily.    Marland Kitchen. atorvastatin (LIPITOR) 40 MG tablet Take 1 tablet (40 mg total) by mouth daily at 6 PM. 30 tablet 0  . carvedilol (COREG) 6.25 MG tablet Take 1 tablet (6.25 mg total) by mouth 2 (two) times daily with a meal. 60 tablet 0  . cetirizine (ZYRTEC) 10 MG tablet Take 10 mg by mouth daily.    . Cholecalciferol (VITAMIN D-3) 5000 units TABS Take 1,500 Units by mouth daily.    . Multiple Vitamin (MULTIVITAMIN WITH MINERALS) TABS  tablet Take 1 tablet by mouth daily.    . naproxen sodium (ALEVE) 220 MG tablet Take 220 mg by mouth daily as needed (knee pain).    . nitroGLYCERIN (NITROSTAT) 0.4 MG SL tablet Place 1 tablet (0.4 mg total) under the tongue every 5 (five) minutes as needed for chest pain. 30 tablet 0  . Omega-3 Fatty Acids (FISH OIL TRIPLE STRENGTH) 1400 MG CAPS Take 1,400 mg by mouth daily.     No facility-administered medications prior to visit.      Allergies:   Halibut liver oil [fish oil] and Shrimp [shellfish allergy]   Social History   Social History  . Marital status: Divorced    Spouse name: N/A  . Number of children: N/A  . Years of education: N/A    Social History Main Topics  . Smoking status: Never Smoker  . Smokeless tobacco: Never Used  . Alcohol use No  . Drug use: No  . Sexual activity: Not Asked   Other Topics Concern  . None   Social History Narrative  . None     Family History:  No early CAD   ROS:   Please see the history of present illness.    ROS All other systems reviewed and are negative.   PHYSICAL EXAM:   VS:  BP 118/80 (BP Location: Right Arm)   Pulse 68   Ht 6\' 2"  (1.88 m)   Wt 220 lb 6.4 oz (100 kg)   BMI 28.30 kg/m    GEN: Well nourished, well developed, in no acute distress  HEENT: normal  Neck: no JVD, carotid bruits, or masses Cardiac: RRR; no murmurs, rubs, or gallops,no edema  Respiratory:  clear to auscultation bilaterally, normal work of breathing GI: soft, nontender, nondistended, + BS MS: no deformity or atrophy  Skin: warm and dry, no rash Neuro:  Alert and Oriented x 3, Strength and sensation are intact Psych: euthymic mood, full affect  Wt Readings from Last 3 Encounters:  01/08/17 220 lb 6.4 oz (100 kg)  01/05/17 220 lb (99.8 kg)      Studies/Labs Reviewed:   EKG:  01/05/17 - NSR with very subtle ST depression (NSSTW changes) V3-4, Personally viewed. EKG from 12/12/16 shows heart rate 90, PACs, nonspecific ST-T wave changes.  Recent Labs: 01/05/2017: BUN 12; Creatinine, Ser 0.99; Hemoglobin 14.9; Platelets 209; Potassium 3.9; Sodium 139   Lipid Panel No results found for: CHOL, TRIG, HDL, CHOLHDL, VLDL, LDLCALC, LDLDIRECT  Additional studies/ records that were reviewed today include:  CXR - WNL Trop normal ER visit notes reviewed.    ASSESSMENT:    1. Unstable angina (HCC)   2. Chest pain, unspecified type   3. Pre-procedure lab exam   4. Psoriasis      PLAN:  In order of problems listed above:  Unstable angina  - ER visit 01/05/17 - trop x 2 normal, ECG with NSSTW changes  - No DM, no FHX, no prior CAD  - He has had multiple episodes that seem to be  escalating, increasing. Worst episode was while walking down long hallway at [facility] developing central chest pain with left arm radiation and diaphoresis that relieved itself after 20 minutes. Concerning symptom. He is worried about this.  - We'll proceed with left heart catheterization, radial artery approach. Risks of stroke, MI, death, renal impairment, bleeding discussed. Normal creatinine 0.99, solitary kidney, hydration. He has a shellfish allergy listed however he states that he has "nasal discharge ", runny nose with  this and has never had any trouble breathing or hives or rash.  - Continue with beta blocker and statin for now.  Multiple allergies  - list reviewed  - We discussed shrimp allergy - he said that he only develops a runny nose occasionally when eating shrimp. He has never developed any trouble breathing, no hives or any anaphylactic reaction. I asked him to take a Benadryl and a Pepcid the morning of the procedure. I will not prescribe prednisone prior.  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 Creatinine 0.9    Medication Adjustments/Labs and Tests Ordered: Current medicines are reviewed at length with the patient today.  Concerns regarding medicines are outlined above.  Medication changes, Labs and Tests ordered today are listed in the Patient Instructions below. Patient Instructions  Medication Instructions:  The current medical regimen is effective;  continue present plan and medications.  Labwork: Please have blood work today (BMP, CBC and INR/PT)  Testing/Procedures: Your physician has requested that you have a cardiac catheterization. Cardiac catheterization is used to diagnose and/or treat various heart conditions. Doctors may recommend this procedure for a number of different reasons. The most common reason is to evaluate chest pain. Chest pain can be a symptom of coronary artery disease (CAD), and cardiac catheterization  can show whether plaque is narrowing or blocking your heart's arteries. This procedure is also used to evaluate the valves, as well as measure the blood flow and oxygen levels in different parts of your heart. For further information please visit [email or web address] Please follow instruction sheet, as given.  Follow-Up: Follow up approximately 2 weeks after your cardiac cath.  Thank you for choosing [facility]!!         Signed, Tyler Schultz, MD  01/08/2017 9:14 AM    [facility] [address] [place], [place], [place]  [address] Phone: [phone]; Fax: [phone]

## 2017-01-09 NOTE — Interval H&P Note (Signed)
Cath Lab Visit (complete for each Cath Lab visit)  Clinical Evaluation Leading to the Procedure:   ACS: Yes.    Non-ACS:    Anginal Classification: CCS III  Anti-ischemic medical therapy: Minimal Therapy (1 class of medications)  Non-Invasive Test Results: No non-invasive testing performed  Prior CABG: No previous CABG      History and Physical Interval Note:  01/09/2017 11:26 AM  [name]  has presented today for surgery, with the diagnosis of cp  The various methods of treatment have been discussed with the patient and family. After consideration of risks, benefits and other options for treatment, the patient has consented to  Procedure(s): Left Heart Cath and Coronary Angiography (N/A) as a surgical intervention .  The patient's history has been reviewed, patient examined, no change in status, stable for surgery.  I have reviewed the patient's chart and labs.  Questions were answered to the patient's satisfaction.     Lyn RecordsHenry W Dakarai Mcglocklin III

## 2017-01-09 NOTE — Progress Notes (Signed)
Report and Care Received from Debbie,RN. Patient resting in bed comfortably at this time. 3cc removed from TR band for a total for 11cc remaining. Rt Radial site WNL. Will continue to monitor patient.

## 2017-01-09 NOTE — Progress Notes (Signed)
Report and care transferred to [facility]. Patient resting comfortably. TR band has 8cc of air in it. Rt radial site WNL.

## 2017-01-09 NOTE — Progress Notes (Signed)
ANTICOAGULATION CONSULT NOTE - Initial Consult  Pharmacy Consult for Lovenox Indication: severe multivessel CAD  Allergies  Allergen Reactions  . Halibut Liver Oil [Fish Oil] Other (See Comments)    Halibut fish causes runny nose  . Shrimp [Shellfish Allergy] Other (See Comments)    Causes runny nose    Patient Measurements: Height: 6\' 2"  (188 cm) Weight: 215 lb 6.4 oz (97.7 kg) IBW/kg (Calculated) : 82.2   Vital Signs: Temp: 98.6 F (37 C) (02/21 1454) Temp Source: Oral (02/21 1454) BP: 150/76 (02/21 1454) Pulse Rate: 72 (02/21 1454)  Labs:  Recent Labs  01/08/17 0909  HCT 41.7  PLT 231  LABPROT 10.8  INR 1.0  CREATININE 0.97    Estimated Creatinine Clearance: 85.9 mL/min (by C-G formula based on SCr of 0.97 mg/dL).   Medical History: Past Medical History:  Diagnosis Date  . 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 Pharmacy asked to start lovenox post-cath pending CVTS consult for CABG. Radial sheath removed and TR band applied at 12:20. Renal function, CBC wnl.  Goal of Therapy:  Anti-Xa level 0.6-1 units/ml 4hrs after LMWH dose given Monitor platelets by anticoagulation protocol: Yes   Plan:  1) At 2030, begin lovenox 95mg  sq q12 2) CBC q72h 3) Follow up plan for CABG  Fredrik RiggerMarkle, [name] 01/09/2017,3:07 PM

## 2017-01-09 NOTE — Consult Note (Signed)
[address]       [place] [address]             [phone]        [name] Medical Record #[id number] Date of Birth: [birth year]  Referring: Dr [name] Primary Care: No PCP Per Patient  Chief Complaint:  Unstable angina  History of Present Illness:    The patient is a 66 year old male referred for cardiothoracic surgical consultation. He underwent cardiac catheterization on today's date. He was found to have severe proximal LAD lesion and several other blockages in the 50-55% range. He has a history of hyperlipidemia and hypertension. He was in town for his sister's funeral but [place] in [place]. He notes  chest pressure over the last 2 weeks and notes that it has been progressive in nature but does resolve with rest. He was referred [facility] after being seen in the urgent care and he was set up for an exercise treadmill test. He developed increasing chest pain as well as diaphoresis with radiation down his left arm. He took 2 325 mg aspirin tablets and came to the emergency department. EKG showed ST depression in leads V3 and his first troponin was negative. He was d/c from ER Saturday night. He was seen in cardiology office Monday by Dr [name] and arranged for outpatient cath today . 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    Current Activity/ Functional Status: Patient is independent with mobility/ambulation, transfers, ADL's, IADL's.   Zubrod Score: At the time of surgery this patient's most appropriate activity status/level should be described as: []     0    Normal activity, no symptoms [x]     1    Restricted in physical strenuous activity but ambulatory, able to do out light work []     2    Ambulatory and capable of self care, unable to do work activities, up and about                 more than 50%  Of the time                            []     3    Only limited self care, in  bed greater than 50% of waking hours []     4    Completely disabled, no self care, confined to bed or chair []     5    Moribund  Past Medical History:  Diagnosis Date  . Renal disorder    born with 1 funcitoning kidney   Remote episodes of PAF Plaque psoriasis Hyperlipidemia Hypertension BPH Kidney stone 2 years ago Vit D def Low testosterone  Past Surgical History:  Procedure Laterality Date  . SPHENOIDECTOMY    bilateral meniscus surgery sinusectomy 1981  History  Smoking Status  . Never Smoker  Smokeless Tobacco  . Never Used    History  Alcohol Use No    Social History   Social History  . Marital status: Divorced    Spouse name: N/A  . Number of children: N/A  . Years of education: N/A   Occupational History  . Not on file.   Social History Main Topics  . Smoking status: Never Smoker  . Smokeless tobacco: Never Used  . Alcohol use No  . Drug use: No  . Sexual  activity: Not on file   Other Topics Concern  . Not on file   Social History Narrative  . No narrative on file    Allergies  Allergen Reactions  . Halibut Liver Oil [Fish Oil] Other (See Comments)    Halibut fish causes runny nose  . Shrimp [Shellfish Allergy] Other (See Comments)    Causes runny nose    Current Facility-Administered Medications  Medication Dose Route Frequency Provider Last Rate Last Dose  . 0.9% sodium chloride infusion  1 mL/kg/hr Intravenous Continuous Tyler Records, MD        Prescriptions Prior to Admission  Medication Sig Dispense Refill Last Dose  . aspirin EC 81 MG tablet Take 81 mg by mouth daily.   01/09/2017 at 0500  . atorvastatin (LIPITOR) 40 MG tablet Take 1 tablet (40 mg total) by mouth daily at 6 PM. 30 tablet 0 01/08/2017 at 1800  . carvedilol (COREG) 6.25 MG tablet Take 1 tablet (6.25 mg total) by mouth 2 (two) times daily with a meal. 60 tablet 0 01/09/2017 at 0500  . cetirizine (ZYRTEC) 10 MG tablet Take 10 mg by mouth daily.   01/09/2017 at 0500    . Cholecalciferol (VITAMIN D-3) 5000 units TABS Take 1,500 Units by mouth daily.   01/09/2017 at 0500  . Multiple Vitamin (MULTIVITAMIN WITH MINERALS) TABS tablet Take 1 tablet by mouth daily.   01/09/2017 at 0500  . [DISCONTINUED] Omega-3 Fatty Acids (FISH OIL TRIPLE STRENGTH) 1400 MG CAPS Take 1,400 mg by mouth daily.   01/09/2017 at 0500  . nitroGLYCERIN (NITROSTAT) 0.4 MG SL tablet Place 1 tablet (0.4 mg total) under the tongue every 5 (five) minutes as needed for chest pain. 30 tablet 0 never  . [DISCONTINUED] naproxen sodium (ALEVE) 220 MG tablet Take 220 mg by mouth daily as needed (knee pain).   More than a month at Unknown time    Family History: Father had peripheral vascular disease   Review of Systems:   Review of Systems  Constitutional: Positive for diaphoresis and malaise/fatigue. Negative for chills, fever and weight loss.  HENT: Positive for hearing loss. Negative for congestion, ear discharge, ear pain, nosebleeds, sinus pain, sore throat and tinnitus.   Eyes: Positive for discharge. Negative for blurred vision, double vision, photophobia, pain and redness.  Respiratory: Positive for shortness of breath. Negative for cough, hemoptysis, sputum production, wheezing and stridor.   Cardiovascular: Positive for chest pain. Negative for palpitations, orthopnea, claudication, leg swelling and PND.  Gastrointestinal: Positive for constipation and heartburn. Negative for abdominal pain, blood in stool, diarrhea, melena, nausea and vomiting.  Genitourinary: Positive for flank pain. Negative for dysuria, frequency, hematuria and urgency.       Prostatisis in 90's- cleared with abx and never reecurred  Musculoskeletal: Positive for joint pain. Negative for back pain, falls, myalgias and neck pain.  Skin: Negative for itching and rash.  Neurological: Positive for weakness. Negative for dizziness, tingling, tremors, sensory change, speech change, focal weakness, seizures, loss of  consciousness and headaches.  Endo/Heme/Allergies: Positive for environmental allergies. Negative for polydipsia. Does not bruise/bleed easily.       No DM  Thyroid- hypothyroid on synthroid in past- no current Vit D deficiency Low testosterone  Psychiatric/Behavioral: Positive for depression. Negative for hallucinations, memory loss, substance abuse and suicidal ideas. The patient is nervous/anxious. The patient does not have insomnia.        ADHD   Physical Exam: BP (!) 147/79   Pulse 71  Temp 97.9 F (36.6 C) (Oral)   Resp 13   Ht 6\' 2"  (1.88 m)   Wt 220 lb 5 oz (99.9 kg)   SpO2 96%   BMI 28.29 kg/m   Physical Exam  Constitutional: He appears healthy. No distress.  HENT:  Nose: No nasal discharge.  Mouth/Throat: No dental caries. Oropharynx is clear. Pharynx is normal.  Eyes: Conjunctivae are normal. Pupils are equal, round, and reactive to light.  Neck: Normal range of motion and thyroid normal. Neck supple. No JVD present. No neck adenopathy. No thyromegaly present.  Cardiovascular: Regular rhythm and normal pulses.  Exam reveals no S3 and no S4.   Murmur heard.  Low-pitched holosystolic murmur is present with a grade of 1/6  Pulmonary/Chest: Breath sounds normal. He has no wheezes. He has no rales. He exhibits no tenderness.  Abdominal: Soft. Bowel sounds are normal. He exhibits no distension and no mass. There is no hepatomegaly. There is no tenderness.  Musculoskeletal: He exhibits no edema, tenderness or deformity.  Neurological: He is alert and oriented to person, place, and time.  Skin: Skin is warm and dry. No rash noted. No cyanosis. No jaundice or pallor. Nails show no clubbing.   Diagnostic Studies & Laboratory data:     Recent Radiology Findings:  Dg Chest 2 View  Result Date: 01/05/2017 CLINICAL DATA:  Shortness of breath for 3 weeks. Midsternal chest pain earlier today with left arm pain and tightness. EXAM: CHEST  2 VIEW COMPARISON:  None. FINDINGS: The  heart size and mediastinal contours are normal. The lungs are clear. There is no pleural effusion or pneumothorax. No acute osseous findings are identified. EKG snaps overlie the chest. IMPRESSION: No active cardiopulmonary process. Electronically Signed   By: Carey Bullocks M.D.   On: 01/05/2017 15:40   I have independently reviewed the above radiologic studies.  Recent Lab Findings: Lab Results  Component Value Date   WBC 7.3 01/08/2017   HGB 14.9 01/05/2017   HCT 41.7 01/08/2017   PLT 231 01/08/2017   GLUCOSE 96 01/08/2017   NA 142 01/08/2017   K 4.7 01/08/2017   CL 100 01/08/2017   CREATININE 0.97 01/08/2017   BUN 13 01/08/2017   CO2 24 01/08/2017   INR 1.0 01/08/2017   Tyler Records, MD (Primary)    Procedures   Left Heart Cath and Coronary Angiography  Conclusion    Ostial to proximal 50% region of stenosis in the LAD followed by proximal to mid 99% stenosis distal to a large first diagonal. First agonal eccentric 50-60% stenosis.  Diffuse generalized narrowing of the left main, less than 50%  Ostial to proximal eccentric 50% right coronary.  Normal LV function. EF 65%.   RECOMMENDATIONS:   After considering treatment options, we will discuss with TCTS. I am concerned with treating the proximal to mid LAD 99% lesion and leaving behind ostial disease. Best treatment option in this vigorous outdoorsman would be LIMA to LAD and SVG to diagonal. Discuss whether the right and circumflex should be grafted.  Keep in the hospital.  ACS dose Lovenox.  Indications   ACS (acute coronary syndrome) (HCC) [I24.9 (ICD-10-CM)]  CAD in native artery [I25.10 (ICD-10-CM)]  Procedural Details/Technique   Technical Details The right radial area was sterilely prepped and draped. Intravenous sedation with Versed and fentanyl was administered. 1% Xylocaine was infiltrated to achieve local analgesia. A double wall stick with an angiocath was utilized to obtain intra-arterial  access. The modified Seldinger technique was  used to place a 1F " Slender" sheath in the right radial artery. Weight based heparin was administered. Coronary angiography was done using 5 F catheters. Right coronary angiography was performed with a JR4. Left ventricular hemodymic recordings and angiography was done using the JR 4 catheter and hand injection. Left coronary angiography was performed with a JL 3.5 cm.  Hemostasis was achieved using a pneumatic band.  During this procedure the patient is administered a total of Versed 1 mg and Fentanyl 50 mg to achieve and maintain moderate conscious sedation. The patient's heart rate, blood pressure, and oxygen saturation are monitored continuously during the procedure. The period of conscious sedation is 44 minutes, of which I was present face-to-face 100% of this time.   Estimated blood loss <50 mL.  During this procedure the patient was administered the following to achieve and maintain moderate conscious sedation: Versed 1 mg, Fentanyl 50 mcg, while the patient's heart rate, blood pressure, and oxygen saturation were continuously monitored. The period of conscious sedation was 44 minutes, of which I was present face-to-face 100% of this time.    Coronary Findings   Dominance: Right  Left Main  LM lesion, 50% stenosed. The lesion is eccentric.  Left Anterior Descending  Prox LAD lesion, 95% stenosed.  First Diagonal Branch  Ost 1st Diag to 1st Diag lesion, 60% stenosed.  Left Circumflex  Ost Cx to Prox Cx lesion, 55% stenosed.  Right Coronary Artery  Ost RCA lesion, 50% stenosed. The lesion is eccentric.  Wall Motion              Left Heart   Left Ventricle The left ventricular size is normal. The left ventricular ejection fraction is 55-65% by visual estimate. No regional wall motion abnormalities.    Coronary Diagrams   Diagnostic Diagram     Implants     No implant documentation for this case.  PACS Images   Show images  for Cardiac catheterization   Link to Procedure Log   Procedure Log    Hemo Data   Flowsheet Row Most Recent Value  AO Systolic Pressure 129 mmHg  AO Diastolic Pressure 69 mmHg  AO Mean 96 mmHg  LV Systolic Pressure 126 mmHg  LV Diastolic Pressure 8 mmHg  LV EDP 14 mmHg  Arterial Occlusion Pressure Extended Systolic Pressure 125 mmHg  Arterial Occlusion Pressure Extended Diastolic Pressure 70 mmHg  Arterial Occlusion Pressure Extended Mean Pressure 93 mmHg  Left Ventricular Apex Extended Systolic Pressure 132 mmHg  Left Ventricular Apex Extended Diastolic Pressure 9 mmHg  Left Ventricular Apex Extended EDP Pressure 14 mmHg  Order-Level Documents:    I have independently reviewed the above  cath films and reviewed the findings with the  patient .    Assessment / Plan:  Severe CAD, esp involving proximal LAD  with crescendo angina, Agree with Dr Katrinka Blazing with complex anatomy of lad, diag with 3 vessel disease CABG offers best long term treatment for review of pain and preservation of LV function.     Plan:  CABG planned  Friday am The goals risks and alternatives of the planned surgical procedure CABG have been discussed with the patient in detail. The risks of the procedure including death, infection, stroke, myocardial infarction, bleeding, blood transfusion have all been discussed specifically.  I have quoted Angelene Giovanni Hodges a 2 % of perioperative mortality and a complication rate as high as 30 %. The patient's questions have been answered.Srihaan Mastrangelo Knock is willing  to proceed  with the planned procedure.   I  spent 45  minutes counseling the patient face to face and 50% or more the  time was spent in counseling and coordination of care. The total time spent in the was  60 minutes.

## 2017-01-10 ENCOUNTER — Inpatient Hospital Stay (HOSPITAL_COMMUNITY): Payer: Medicare Other

## 2017-01-10 ENCOUNTER — Encounter (HOSPITAL_COMMUNITY): Payer: Medicare Other

## 2017-01-10 ENCOUNTER — Encounter (HOSPITAL_COMMUNITY): Payer: Self-pay | Admitting: *Deleted

## 2017-01-10 DIAGNOSIS — Z01810 Encounter for preprocedural cardiovascular examination: Secondary | ICD-10-CM

## 2017-01-10 DIAGNOSIS — E785 Hyperlipidemia, unspecified: Secondary | ICD-10-CM

## 2017-01-10 LAB — VAS US DOPPLER PRE CABG
LEFT ECA DIAS: -24 cm/s
LEFT VERTEBRAL DIAS: 18 cm/s
Left CCA dist dias: -20 cm/s
Left CCA dist sys: -86 cm/s
Left CCA prox dias: 31 cm/s
Left CCA prox sys: 162 cm/s
Left ICA dist dias: -39 cm/s
Left ICA dist sys: -106 cm/s
Left ICA prox dias: -25 cm/s
Left ICA prox sys: -80 cm/s
RIGHT ECA DIAS: -21 cm/s
RIGHT VERTEBRAL DIAS: 15 cm/s
Right CCA prox dias: 27 cm/s
Right CCA prox sys: 109 cm/s
Right cca dist sys: -96 cm/s

## 2017-01-10 LAB — CBC
HEMATOCRIT: 41 % (ref 39.0–52.0)
Hemoglobin: 13.5 g/dL (ref 13.0–17.0)
MCH: 30.7 pg (ref 26.0–34.0)
MCHC: 32.9 g/dL (ref 30.0–36.0)
MCV: 93.2 fL (ref 78.0–100.0)
PLATELETS: 231 10*3/uL (ref 150–400)
RBC: 4.4 MIL/uL (ref 4.22–5.81)
RDW: 13.2 % (ref 11.5–15.5)
WBC: 8.9 10*3/uL (ref 4.0–10.5)

## 2017-01-10 LAB — URINALYSIS, ROUTINE W REFLEX MICROSCOPIC
Bilirubin Urine: NEGATIVE
Glucose, UA: NEGATIVE mg/dL
Hgb urine dipstick: NEGATIVE
Ketones, ur: NEGATIVE mg/dL
Leukocytes, UA: NEGATIVE
Nitrite: NEGATIVE
Protein, ur: NEGATIVE mg/dL
Specific Gravity, Urine: 1.008 (ref 1.005–1.030)
pH: 7 (ref 5.0–8.0)

## 2017-01-10 LAB — BLOOD GAS, ARTERIAL
Acid-Base Excess: 4.9 mmol/L — ABNORMAL HIGH (ref 0.0–2.0)
Bicarbonate: 29.2 mmol/L — ABNORMAL HIGH (ref 20.0–28.0)
Drawn by: 257881
FIO2: 0.21
O2 Saturation: 93.7 %
Patient temperature: 98.6
pCO2 arterial: 46.1 mmHg (ref 32.0–48.0)
pH, Arterial: 7.418 (ref 7.350–7.450)
pO2, Arterial: 69.6 mmHg — ABNORMAL LOW (ref 83.0–108.0)

## 2017-01-10 LAB — COMPREHENSIVE METABOLIC PANEL
ALT: 25 U/L (ref 17–63)
AST: 27 U/L (ref 15–41)
Albumin: 3.9 g/dL (ref 3.5–5.0)
Alkaline Phosphatase: 53 U/L (ref 38–126)
Anion gap: 10 (ref 5–15)
BUN: 10 mg/dL (ref 6–20)
CO2: 25 mmol/L (ref 22–32)
Calcium: 9.3 mg/dL (ref 8.9–10.3)
Chloride: 103 mmol/L (ref 101–111)
Creatinine, Ser: 0.92 mg/dL (ref 0.61–1.24)
GFR calc Af Amer: >60 mL/min (ref >60)
GFR calc non Af Amer: >60 mL/min (ref >60)
Glucose, Bld: 84 mg/dL (ref 65–99)
Potassium: 3.9 mmol/L (ref 3.5–5.1)
Sodium: 138 mmol/L (ref 135–145)
Total Bilirubin: 0.9 mg/dL (ref 0.3–1.2)
Total Protein: 6.6 g/dL (ref 6.5–8.1)

## 2017-01-10 LAB — ECHOCARDIOGRAM COMPLETE
Height: 74 in
Weight: 3448 oz

## 2017-01-10 LAB — TYPE AND SCREEN
ABO/RH(D): A POS
Antibody Screen: NEGATIVE

## 2017-01-10 LAB — PROTIME-INR
INR: 1.11
Prothrombin Time: 14.3 seconds (ref 11.4–15.2)

## 2017-01-10 LAB — ABO/RH: ABO/RH(D): A POS

## 2017-01-10 MED ORDER — DEXMEDETOMIDINE HCL IN NACL 400 MCG/100ML IV SOLN
0.1000 ug/kg/h | INTRAVENOUS | Status: AC
  Administered 2017-01-11: .3 ug/kg/h via INTRAVENOUS
  Filled 2017-01-10: qty 100

## 2017-01-10 MED ORDER — EPINEPHRINE PF 1 MG/ML IJ SOLN
0.0000 ug/min | INTRAVENOUS | Status: DC
  Filled 2017-01-10: qty 4

## 2017-01-10 MED ORDER — VANCOMYCIN HCL 10 G IV SOLR
1500.0000 mg | INTRAVENOUS | Status: AC
  Administered 2017-01-11: 1500 mg via INTRAVENOUS
  Filled 2017-01-10 (×2): qty 1500

## 2017-01-10 MED ORDER — TRANEXAMIC ACID (OHS) BOLUS VIA INFUSION
15.0000 mg/kg | INTRAVENOUS | Status: AC
  Administered 2017-01-11: 1467 mg via INTRAVENOUS
  Filled 2017-01-10: qty 1467

## 2017-01-10 MED ORDER — MAGNESIUM SULFATE 50 % IJ SOLN
40.0000 meq | INTRAMUSCULAR | Status: DC
  Filled 2017-01-10: qty 10

## 2017-01-10 MED ORDER — SODIUM CHLORIDE 0.9 % IV SOLN: INTRAVENOUS | Status: DC

## 2017-01-10 MED ORDER — DEXTROSE 5 % IV SOLN
1.5000 g | INTRAVENOUS | Status: AC
  Administered 2017-01-11: 1.5 g via INTRAVENOUS
  Administered 2017-01-11: .75 g via INTRAVENOUS
  Filled 2017-01-10: qty 1.5

## 2017-01-10 MED ORDER — SODIUM CHLORIDE 0.9 % IV SOLN
INTRAVENOUS | Status: DC
  Filled 2017-01-10: qty 30

## 2017-01-10 MED ORDER — TEMAZEPAM 15 MG PO CAPS
15.0000 mg | ORAL_CAPSULE | Freq: Once | ORAL | Status: AC | PRN
  Administered 2017-01-10: 15 mg via ORAL
  Filled 2017-01-10: qty 1

## 2017-01-10 MED ORDER — ENOXAPARIN SODIUM 100 MG/ML ~~LOC~~ SOLN
100.0000 mg | Freq: Two times a day (BID) | SUBCUTANEOUS | Status: DC
  Administered 2017-01-10: 100 mg via SUBCUTANEOUS
  Filled 2017-01-10: qty 1

## 2017-01-10 MED ORDER — DOPAMINE-DEXTROSE 3.2-5 MG/ML-% IV SOLN
0.0000 ug/kg/min | INTRAVENOUS | Status: DC
  Filled 2017-01-10: qty 250

## 2017-01-10 MED ORDER — PLASMA-LYTE 148 IV SOLN
INTRAVENOUS | Status: AC
  Administered 2017-01-11: 500 mL
  Filled 2017-01-10: qty 2.5

## 2017-01-10 MED ORDER — SODIUM CHLORIDE 0.9 % IV SOLN
INTRAVENOUS | Status: AC
  Administered 2017-01-11: .8 [IU]/h via INTRAVENOUS
  Filled 2017-01-10: qty 2.5

## 2017-01-10 MED ORDER — TRANEXAMIC ACID 1000 MG/10ML IV SOLN
1.5000 mg/kg/h | INTRAVENOUS | Status: AC
  Administered 2017-01-11: 1.5 mg/kg/h via INTRAVENOUS
  Filled 2017-01-10: qty 25

## 2017-01-10 MED ORDER — NITROGLYCERIN IN D5W 200-5 MCG/ML-% IV SOLN
2.0000 ug/min | INTRAVENOUS | Status: AC
  Administered 2017-01-11: 10 ug/min via INTRAVENOUS

## 2017-01-10 MED ORDER — SODIUM CHLORIDE 0.9 % IV SOLN
30.0000 ug/min | INTRAVENOUS | Status: DC
  Filled 2017-01-10: qty 2

## 2017-01-10 MED ORDER — PLASMA-LYTE 148 IV SOLN
INTRAVENOUS | Status: DC
  Filled 2017-01-10: qty 2.5

## 2017-01-10 MED ORDER — BISACODYL 5 MG PO TBEC
5.0000 mg | DELAYED_RELEASE_TABLET | Freq: Once | ORAL | Status: DC
Start: 2017-01-10 — End: 2017-01-11

## 2017-01-10 MED ORDER — METOPROLOL TARTRATE 12.5 MG HALF TABLET
12.5000 mg | ORAL_TABLET | Freq: Once | ORAL | Status: AC
  Administered 2017-01-11: 12.5 mg via ORAL
  Filled 2017-01-10: qty 1

## 2017-01-10 MED ORDER — POTASSIUM CHLORIDE 2 MEQ/ML IV SOLN
80.0000 meq | INTRAVENOUS | Status: DC
  Filled 2017-01-10: qty 40

## 2017-01-10 MED ORDER — DEXTROSE 5 % IV SOLN
750.0000 mg | INTRAVENOUS | Status: DC
  Filled 2017-01-10: qty 750

## 2017-01-10 MED ORDER — CHLORHEXIDINE GLUCONATE CLOTH 2 % EX PADS
6.0000 | MEDICATED_PAD | Freq: Once | CUTANEOUS | Status: AC
  Administered 2017-01-10: 6 via TOPICAL

## 2017-01-10 MED ORDER — DEXTROSE 5 % IV SOLN
1.5000 g | INTRAVENOUS | Status: DC
  Filled 2017-01-10: qty 1.5

## 2017-01-10 MED ORDER — HEPARIN SODIUM (PORCINE) 1000 UNIT/ML IJ SOLN
INTRAMUSCULAR | Status: DC
  Filled 2017-01-10: qty 30

## 2017-01-10 MED ORDER — TRANEXAMIC ACID (OHS) PUMP PRIME SOLUTION
2.0000 mg/kg | INTRAVENOUS | Status: DC
  Filled 2017-01-10: qty 1.96

## 2017-01-10 MED ORDER — CHLORHEXIDINE GLUCONATE 0.12 % MT SOLN
15.0000 mL | Freq: Once | OROMUCOSAL | Status: AC
  Administered 2017-01-11: 15 mL via OROMUCOSAL
  Filled 2017-01-10: qty 15

## 2017-01-10 MED ORDER — DEXTROSE 5 % IV SOLN
0.0000 ug/min | INTRAVENOUS | Status: DC
  Filled 2017-01-10: qty 4

## 2017-01-10 MED ORDER — DEXMEDETOMIDINE HCL IN NACL 400 MCG/100ML IV SOLN
0.1000 ug/kg/h | INTRAVENOUS | Status: DC
Start: 2017-01-11 — End: 2017-01-10
  Filled 2017-01-10: qty 100

## 2017-01-10 MED ORDER — PHENYLEPHRINE HCL 10 MG/ML IJ SOLN
30.0000 ug/min | INTRAMUSCULAR | Status: AC
  Administered 2017-01-11: 20 ug/min via INTRAVENOUS
  Filled 2017-01-10: qty 2

## 2017-01-10 MED ORDER — CHLORHEXIDINE GLUCONATE CLOTH 2 % EX PADS
6.0000 | MEDICATED_PAD | Freq: Once | CUTANEOUS | Status: AC
  Administered 2017-01-11: 6 via TOPICAL

## 2017-01-10 MED ORDER — SODIUM CHLORIDE 0.9 % IV SOLN
INTRAVENOUS | Status: DC
  Filled 2017-01-10: qty 2.5

## 2017-01-10 MED ORDER — NITROGLYCERIN IN D5W 200-5 MCG/ML-% IV SOLN
2.0000 ug/min | INTRAVENOUS | Status: DC
  Filled 2017-01-10: qty 250

## 2017-01-10 MED ORDER — VANCOMYCIN HCL 10 G IV SOLR
1250.0000 mg | INTRAVENOUS | Status: DC
  Filled 2017-01-10: qty 1250

## 2017-01-10 NOTE — Progress Notes (Addendum)
Progress Note  Patient Name: Wynetta FinesVictor Macomber Patton Date of Encounter: 01/10/2017  Primary Cardiologist: skains    Subjective   Pt says he had some chest tightness earlier  Got excited talking  No nnow  Breathing is OK   Inpatient Medications    Scheduled Meds: . aspirin  81 mg Oral Daily  . atorvastatin  80 mg Oral q1800  . carvedilol  6.25 mg Oral BID WC  . loratadine  10 mg Oral Daily  . multivitamin with minerals  1 tablet Oral Daily  . sodium chloride flush  3 mL Intravenous Q12H   Continuous Infusions:  PRN Meds: sodium chloride, acetaminophen, morphine injection, nitroGLYCERIN, ondansetron (ZOFRAN) IV, oxyCODONE-acetaminophen, sodium chloride flush   Vital Signs    Vitals:   01/09/17 2009 01/10/17 0030 01/10/17 0445 01/10/17 0754  BP: (!) 142/66 123/81 126/69 (!) 126/56  Pulse: 64 70 64 71  Resp: 18 18 18 18   Temp: 98.6 F (37 C) 98.3 F (36.8 C) 98.4 F (36.9 C) 98.8 F (37.1 C)  TempSrc: Oral Oral Oral Oral  SpO2: 97% 95% 95% 96%  Weight:   215 lb 8 oz (97.8 kg)   Height:        Intake/Output Summary (Last 24 hours) at 01/10/17 0927 Last data filed at 01/10/17 0845  Gross per 24 hour  Intake          3364.78 ml  Output             1870 ml  Net          1494.78 ml   Filed Weights   01/09/17 0818 01/09/17 1454 01/10/17 0445  Weight: 220 lb 5 oz (99.9 kg) 215 lb 6.4 oz (97.7 kg) 215 lb 8 oz (97.8 kg)    Telemetry    SR   ECG     Physical Exam   GEN: No acute distress.   Neck: No JVD Cardiac: RRR, no murmurs, rubs, or gallops.  Respiratory: Clear to auscultation bilaterally. GI: Soft, nontender, non-distended  MS: No edema; No deformity. Neuro:  Nonfocal  Psych: Normal affect   Labs    Chemistry Recent Labs Lab 01/05/17 1502 01/08/17 0909 01/10/17 0448  NA 139 142 138  K 3.9 4.7 3.9  CL 107 100 103  CO2 23 24 25   GLUCOSE 117* 96 84  BUN 12 13 10   CREATININE 0.99 0.97 0.92  CALCIUM 9.8 10.3* 9.3  PROT  --   --  6.6    ALBUMIN  --   --  3.9  AST  --   --  27  ALT  --   --  25  ALKPHOS  --   --  53  BILITOT  --   --  0.9  GFRNONAA >60 80 >60  GFRAA >60 93 >60  ANIONGAP 9  --  10     Hematology Recent Labs Lab 01/05/17 1502 01/08/17 0909 01/10/17 0448  WBC 6.3 7.3 8.9  RBC 4.79 4.60 4.40  HGB 14.9  --  13.5  HCT 44.1 41.7 41.0  MCV 92.1 91 93.2  MCH 31.1 30.9 30.7  MCHC 33.8 34.1 32.9  RDW 12.9 13.4 13.2  PLT 209 231 231    Cardiac EnzymesNo results for input(s): TROPONINI in the last 168 hours.  Recent Labs Lab 01/05/17 1517 01/05/17 2007  TROPIPOC 0.05 0.07     BNPNo results for input(s): BNP, PROBNP in the last 168 hours.   DDimer No results for input(s):  DDIMER in the last 168 hours.   Radiology    No results found.  Cardiac Studies   Conclusion    Ostial to proximal 50% region of stenosis in the LAD followed by proximal to mid 99% stenosis distal to a large first diagonal. First agonal eccentric 50-60% stenosis.  Diffuse generalized narrowing of the left main, less than 50%  Ostial to proximal eccentric 50% right coronary.  Normal LV function. EF 65%.   RECOMMENDATIONS:   After considering treatment options, we will discuss with TCTS. I am concerned with treating the proximal to mid LAD 99% lesion and leaving behind ostial disease. Best treatment option in this vigorous outdoorsman would be LIMA to LAD and SVG to diagonal. Discuss whether the right and circumflex should be grafted.  Keep in the hospital.  ACS dose Lovenox.     Patient Profile       Assessment & Plan   1  CAD  For CABG in AM    Had mild angina this AM  REview of chart  Not clear if got lovenoxi this AM Will review with pharmacy  Got injection this AM  Plan to hold now until surgery    2  HL  High dose lipitor.    Signed, Dietrich Pates, MD  01/10/2017, 9:27 AM

## 2017-01-10 NOTE — Progress Notes (Signed)
CARDIAC REHAB PHASE I   PRE:  Rate/Rhythm: 78 SR  BP:  Sitting: 133/85        SaO2: 95 RA  MODE:  Ambulation: 460 ft   POST:  Rate/Rhythm: 89 SR  BP:  Sitting: 132/90         SaO2: 97 RA  Pt ambulated 460 ft on RA, independent, steady gait, tolerated well with no complaints. Cardiac surgery pre-op education completed with pt. Reviewed IS, sternal precautions, activity progression, cardiac surgery booklet and cardiac surgery guidelines. Pt verbalized understanding. Pt declines cardiac surgery videos at this time. Pt to recliner after walk, call bell within reach. Will follow post-op.   [id number] Tyler GrapesEmily C Carinna Newhart, RN, BSN 01/10/2017 9:15 AM

## 2017-01-10 NOTE — Progress Notes (Signed)
Patient ID: [name], male   DOB: October 17, 1951, 66 y.o.   MRN: [id number]      [address]       [name] [address]             [phone]                 1 Day Post-Op Procedure(s) (LRB): Left Heart Cath and Coronary Angiography (N/A)  LOS: 1 day   Subjective: No chest pain , up in chair discussing postop care with cardiac rehab, echo done this am  Objective: Vital signs in last 24 hours: Patient Vitals for the past 24 hrs:  BP Temp Temp src Pulse Resp SpO2 Height Weight  01/10/17 0754 (!) 126/56 98.8 F (37.1 C) Oral 71 18 96 % - -  01/10/17 0445 126/69 98.4 F (36.9 C) Oral 64 18 95 % - 215 lb 8 oz (97.8 kg)  01/10/17 0030 123/81 98.3 F (36.8 C) Oral 70 18 95 % - -  01/09/17 2009 (!) 142/66 98.6 F (37 C) Oral 64 18 97 % - -  01/09/17 1756 139/81 98.8 F (37.1 C) Oral 72 18 97 % - -  01/09/17 1454 (!) 150/76 98.6 F (37 C) Oral 72 18 - 6\' 2"  (1.88 m) 215 lb 6.4 oz (97.7 kg)  01/09/17 1420 (!) 147/79 - - 71 13 96 % - -  01/09/17 1405 (!) 145/83 - - 69 17 97 % - -  01/09/17 1350 117/72 - - 71 18 95 % - -  01/09/17 1335 136/77 - - 70 16 96 % - -  01/09/17 1320 133/79 - - 68 11 96 % - -  01/09/17 1305 (!) 144/79 - - 84 18 96 % - -  01/09/17 1250 128/87 - - 70 13 96 % - -  01/09/17 1235 (!) 116/91 - - 71 10 98 % - -  01/09/17 1226 128/80 - - 66 (!) 0 (!) 0 % - -  01/09/17 1221 121/84 - - 69 10 99 % - -  01/09/17 1216 114/78 - - 65 (!) 9 99 % - -  01/09/17 1211 114/77 - - 60 11 99 % - -  01/09/17 1206 122/80 - - 67 12 100 % - -  01/09/17 1201 122/79 - - 66 14 99 % - -  01/09/17 1156 117/77 - - 66 13 94 % - -  01/09/17 1151 135/77 - - 69 15 100 % - -  01/09/17 1146 128/84 - - 71 15 100 % - -  01/09/17 1141 (!) 143/89 - - 77 10 100 % - -  01/09/17 1137 (!) 145/88 - - 67 (!) 9 98 % - -  01/09/17 1132 (!) 150/93 - - 74 10 100 % - -  01/09/17 1127 125/86 - - 72 20 100 % - -  01/09/17 1122 134/88 - - 66 20 100 % - -  01/09/17 1117 140/86 - -  65 20 100 % - -  01/09/17 1112 (!) 160/88 - - 71 (!) 8 100 % - -  01/09/17 1111 - - - - - 98 % - -  01/09/17 1108 - - - 74 - - - -  01/09/17 1108 (!) 178/101 - - 72 (!) 8 99 % - -    Filed Weights   01/09/17 0818 01/09/17 1454 01/10/17 0445  Weight: 220 lb 5 oz (99.9 kg) 215 lb 6.4 oz (97.7 kg) 215 lb 8 oz (97.8  kg)    Hemodynamic parameters for last 24 hours:    Intake/Output from previous day: 02/21 0701 - 02/22 0700 In: 3004.8 [P.O.:480; I.V.:2524.8] Out: 1520 [Urine:1520] Intake/Output this shift: Total I/O In: -  Out: 350 [Urine:350]  Scheduled Meds: . aspirin  81 mg Oral Daily  . atorvastatin  80 mg Oral q1800  . carvedilol  6.25 mg Oral BID WC  . enoxaparin (LOVENOX) injection  100 mg Subcutaneous Q12H  . loratadine  10 mg Oral Daily  . multivitamin with minerals  1 tablet Oral Daily  . sodium chloride flush  3 mL Intravenous Q12H   Continuous Infusions: PRN Meds:.sodium chloride, acetaminophen, morphine injection, nitroGLYCERIN, ondansetron (ZOFRAN) IV, oxyCODONE-acetaminophen, sodium chloride flush  General appearance: alert and cooperative Neurologic: intact Heart: regular rate and rhythm, S1, S2 normal, no murmur, click, rub or gallop Lungs: clear to auscultation bilaterally Abdomen: soft, non-tender; bowel sounds normal; no masses,  no organomegaly Extremities: extremities normal, atraumatic, no cyanosis or edema and right radial site ok  Lab Results: CBC: Recent Labs  01/08/17 0909 01/10/17 0448  WBC 7.3 8.9  HGB  --  13.5  HCT 41.7 41.0  PLT 231 231   BMET:  Recent Labs  01/08/17 0909 01/10/17 0448  NA 142 138  K 4.7 3.9  CL 100 103  CO2 24 25  GLUCOSE 96 84  BUN 13 10  CREATININE 0.97 0.92  CALCIUM 10.3* 9.3    PT/INR:  Recent Labs  01/10/17 0448  LABPROT 14.3  INR 1.11     Radiology No results found.   Assessment/Plan: S/P Procedure(s) (LRB): Left Heart Cath and Coronary Angiography (N/A) Plan cabg in am, hold Lovenox  12 hours preop    Tyler OvensEdward Cole Tyler Aarons MD 01/10/2017 8:44 AM

## 2017-01-10 NOTE — Progress Notes (Signed)
ANTICOAGULATION CONSULT NOTE - Initial Consult  Pharmacy Consult for Lovenox Indication: severe multivessel CAD  Allergies  Allergen Reactions  . Halibut Liver Oil [Fish Oil] Other (See Comments)    Halibut fish causes runny nose  . Shrimp [Shellfish Allergy] Other (See Comments)    Causes runny nose    Patient Measurements: Height: 6\' 2"  (188 cm) Weight: 215 lb 8 oz (97.8 kg) IBW/kg (Calculated) : 82.2   Vital Signs: Temp: 98.8 F (37.1 C) (02/22 0754) Temp Source: Oral (02/22 0754) BP: 126/56 (02/22 0754) Pulse Rate: 71 (02/22 0754)  Labs:  Recent Labs  01/08/17 0909 01/10/17 0448  HGB  --  13.5  HCT 41.7 41.0  PLT 231 231  LABPROT 10.8 14.3  INR 1.0 1.11  CREATININE 0.97 0.92    Estimated Creatinine Clearance: 90.6 mL/min (by C-G formula based on SCr of 0.92 mg/dL).   Medical History: Past Medical History:  Diagnosis Date  . Chest pain in adult 01/09/2017  . 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 Pharmacy asked to start lovenox post-cath pending CVTS consult for CABG. Radial sheath removed and TR band applied at 12:20. Renal function, CBC wnl.  Goal of Therapy:  Anti-Xa level 0.6-1 units/ml 4hrs after LMWH dose given Monitor platelets by anticoagulation protocol: Yes   Plan:  Lovenox 1mg /kg q12h CBC q72h Follow up plan for CABG  Arlean Hoppingorey M. Newman PiesBall, PharmD, BCPS Clinical Pharmacist [phone] 01/10/2017,7:56 AM

## 2017-01-10 NOTE — Progress Notes (Signed)
Pre-op Cardiac Surgery  Carotid Findings:  Bilateral: No significant (1-39%) ICA stenosis. Antegrade vertebral flow.    Upper Extremity Right Left  Brachial Pressures N/A restricted arm 123  Radial Waveforms Tri Tri  Ulnar Waveforms Tri Tri  Palmar Arch (Allen's Test) Normal  Normal    Findings:  Pedal artery waveforms within normal limits.  Farrel DemarkJill Eunice, RDMS, RVT 01/10/2017

## 2017-01-10 NOTE — Progress Notes (Signed)
TR band removed at 1600. A 2x2 guaze with tegaderm was applied. Site remains level 0. R pulses remain 2+, extremity is warm to touch, normal color for ethnicity. Vital signs stable. Pt instructed to not put any pressure or use arm. Pt instructed to call if arm begins to bleed. Pt verbalized understanding. Will continue to monitor pt.  Jilda PandaBethany Clarisa Danser RN

## 2017-01-10 NOTE — Progress Notes (Addendum)
Called spiritual care regarding advance directive update per patient request prior to CABG tomorrow 01/11/17. Chaplain to come to bedside to complete.  Called vascular US, spoke with Noreene LarssonJill, to ensure all pre CABG workup was complete prior to CABG tomorrow 01/11/17. All CABG vascular US workup ordered by Dr. Tyrone SageGerhardt is complete. Will continue to monitor. Pt insisted that Dr. Tyrone SageGerhardt said "there would be cuffs on my legs measuring pressures". Paged Dr.Gerhardt to ensure no ABI or vein mapping is needed. Dr. Tyrone SageGerhardt ensured the US done today at 1345 with results is sufficient for CABG tomorrow 01/10/17. Will continue to monitor.

## 2017-01-11 ENCOUNTER — Encounter (HOSPITAL_COMMUNITY): Payer: Self-pay | Admitting: Certified Registered Nurse Anesthetist

## 2017-01-11 ENCOUNTER — Inpatient Hospital Stay (HOSPITAL_COMMUNITY): Payer: Medicare Other

## 2017-01-11 ENCOUNTER — Inpatient Hospital Stay (HOSPITAL_COMMUNITY): Payer: Medicare Other | Admitting: Certified Registered Nurse Anesthetist

## 2017-01-11 ENCOUNTER — Encounter (HOSPITAL_COMMUNITY)
Admission: RE | Disposition: A | Discharge status: Home / Self Care | Payer: Self-pay | Source: Ambulatory Visit | Attending: Cardiothoracic Surgery

## 2017-01-11 DIAGNOSIS — I2510 Atherosclerotic heart disease of native coronary artery without angina pectoris: Secondary | ICD-10-CM | POA: Diagnosis present

## 2017-01-11 HISTORY — PX: CORONARY ARTERY BYPASS GRAFT: SHX141

## 2017-01-11 HISTORY — PX: ARTERIAL LINE INSERTION: CATH118227

## 2017-01-11 HISTORY — PX: ENDOVEIN HARVEST OF GREATER SAPHENOUS VEIN: SHX5059

## 2017-01-11 HISTORY — PX: TEE WITHOUT CARDIOVERSION: SHX5443

## 2017-01-11 LAB — BASIC METABOLIC PANEL
Anion gap: 8 (ref 5–15)
BUN: 13 mg/dL (ref 6–20)
CO2: 26 mmol/L (ref 22–32)
Calcium: 9.4 mg/dL (ref 8.9–10.3)
Chloride: 102 mmol/L (ref 101–111)
Creatinine, Ser: 0.95 mg/dL (ref 0.61–1.24)
GFR calc Af Amer: >60 mL/min (ref >60)
GFR calc non Af Amer: >60 mL/min (ref >60)
Glucose, Bld: 107 mg/dL — ABNORMAL HIGH (ref 65–99)
Potassium: 3.7 mmol/L (ref 3.5–5.1)
Sodium: 136 mmol/L (ref 135–145)

## 2017-01-11 LAB — POCT I-STAT, CHEM 8
BUN: 14 mg/dL (ref 6–20)
BUN: 12 mg/dL (ref 6–20)
BUN: 12 mg/dL (ref 6–20)
BUN: 11 mg/dL (ref 6–20)
BUN: 12 mg/dL (ref 6–20)
BUN: 14 mg/dL (ref 6–20)
CALCIUM ION: 1.26 mmol/L (ref 1.15–1.40)
CALCIUM ION: 1.1 mmol/L — AB (ref 1.15–1.40)
CHLORIDE: 101 mmol/L (ref 101–111)
CHLORIDE: 102 mmol/L (ref 101–111)
CHLORIDE: 102 mmol/L (ref 101–111)
CREATININE: 0.8 mg/dL (ref 0.61–1.24)
CREATININE: 0.8 mg/dL (ref 0.61–1.24)
Calcium, Ion: 1.18 mmol/L (ref 1.15–1.40)
Calcium, Ion: 1.03 mmol/L — ABNORMAL LOW (ref 1.15–1.40)
Calcium, Ion: 1.08 mmol/L — ABNORMAL LOW (ref 1.15–1.40)
Calcium, Ion: 1.15 mmol/L (ref 1.15–1.40)
Chloride: 101 mmol/L (ref 101–111)
Chloride: 103 mmol/L (ref 101–111)
Chloride: 105 mmol/L (ref 101–111)
Creatinine, Ser: 0.8 mg/dL (ref 0.61–1.24)
Creatinine, Ser: 0.8 mg/dL (ref 0.61–1.24)
Creatinine, Ser: 0.9 mg/dL (ref 0.61–1.24)
Creatinine, Ser: 0.8 mg/dL (ref 0.61–1.24)
GLUCOSE: 100 mg/dL — AB (ref 65–99)
GLUCOSE: 162 mg/dL — AB (ref 65–99)
GLUCOSE: 156 mg/dL — AB (ref 65–99)
Glucose, Bld: 114 mg/dL — ABNORMAL HIGH (ref 65–99)
Glucose, Bld: 107 mg/dL — ABNORMAL HIGH (ref 65–99)
Glucose, Bld: 116 mg/dL — ABNORMAL HIGH (ref 65–99)
HCT: 37 % — ABNORMAL LOW (ref 39.0–52.0)
HCT: 30 % — ABNORMAL LOW (ref 39.0–52.0)
HCT: 29 % — ABNORMAL LOW (ref 39.0–52.0)
HCT: 30 % — ABNORMAL LOW (ref 39.0–52.0)
HCT: 35 % — ABNORMAL LOW (ref 39.0–52.0)
HEMATOCRIT: 34 % — AB (ref 39.0–52.0)
HEMOGLOBIN: 11.6 g/dL — AB (ref 13.0–17.0)
HEMOGLOBIN: 11.9 g/dL — AB (ref 13.0–17.0)
Hemoglobin: 12.6 g/dL — ABNORMAL LOW (ref 13.0–17.0)
Hemoglobin: 10.2 g/dL — ABNORMAL LOW (ref 13.0–17.0)
Hemoglobin: 9.9 g/dL — ABNORMAL LOW (ref 13.0–17.0)
Hemoglobin: 10.2 g/dL — ABNORMAL LOW (ref 13.0–17.0)
POTASSIUM: 4.6 mmol/L (ref 3.5–5.1)
POTASSIUM: 4.6 mmol/L (ref 3.5–5.1)
POTASSIUM: 6 mmol/L — AB (ref 3.5–5.1)
POTASSIUM: 4.3 mmol/L (ref 3.5–5.1)
Potassium: 4.1 mmol/L (ref 3.5–5.1)
Potassium: 4.9 mmol/L (ref 3.5–5.1)
SODIUM: 140 mmol/L (ref 135–145)
Sodium: 140 mmol/L (ref 135–145)
Sodium: 139 mmol/L (ref 135–145)
Sodium: 134 mmol/L — ABNORMAL LOW (ref 135–145)
Sodium: 138 mmol/L (ref 135–145)
Sodium: 140 mmol/L (ref 135–145)
TCO2: 28 mmol/L (ref 0–100)
TCO2: 27 mmol/L (ref 0–100)
TCO2: 27 mmol/L (ref 0–100)
TCO2: 26 mmol/L (ref 0–100)
TCO2: 23 mmol/L (ref 0–100)
TCO2: 22 mmol/L (ref 0–100)

## 2017-01-11 LAB — POCT I-STAT 4, (NA,K, GLUC, HGB,HCT)
Glucose, Bld: 123 mg/dL — ABNORMAL HIGH (ref 65–99)
HEMATOCRIT: 36 % — AB (ref 39.0–52.0)
Hemoglobin: 12.2 g/dL — ABNORMAL LOW (ref 13.0–17.0)
Potassium: 4.3 mmol/L (ref 3.5–5.1)
SODIUM: 140 mmol/L (ref 135–145)

## 2017-01-11 LAB — POCT I-STAT 3, ART BLOOD GAS (G3+)
Acid-Base Excess: 2 mmol/L (ref 0.0–2.0)
Acid-base deficit: 2 mmol/L (ref 0.0–2.0)
Acid-base deficit: 3 mmol/L — ABNORMAL HIGH (ref 0.0–2.0)
Acid-base deficit: 2 mmol/L (ref 0.0–2.0)
Acid-base deficit: 3 mmol/L — ABNORMAL HIGH (ref 0.0–2.0)
BICARBONATE: 27 mmol/L (ref 20.0–28.0)
BICARBONATE: 23.2 mmol/L (ref 20.0–28.0)
BICARBONATE: 20.3 mmol/L (ref 20.0–28.0)
BICARBONATE: 22.6 mmol/L (ref 20.0–28.0)
Bicarbonate: 22.7 mmol/L (ref 20.0–28.0)
O2 SAT: 99 %
O2 SAT: 95 %
O2 Saturation: 100 %
O2 Saturation: 97 %
O2 Saturation: 97 %
PCO2 ART: 42.9 mmHg (ref 32.0–48.0)
PCO2 ART: 42.7 mmHg (ref 32.0–48.0)
PCO2 ART: 26.9 mmHg — AB (ref 32.0–48.0)
PH ART: 7.406 (ref 7.350–7.450)
PH ART: 7.486 — AB (ref 7.350–7.450)
PH ART: 7.329 — AB (ref 7.350–7.450)
PO2 ART: 308 mmHg — AB (ref 83.0–108.0)
PO2 ART: 81 mmHg — AB (ref 83.0–108.0)
Patient temperature: 36.8
TCO2: 28 mmol/L (ref 0–100)
TCO2: 24 mmol/L (ref 0–100)
TCO2: 24 mmol/L (ref 0–100)
TCO2: 21 mmol/L (ref 0–100)
TCO2: 24 mmol/L (ref 0–100)
pCO2 arterial: 37.3 mmHg (ref 32.0–48.0)
pCO2 arterial: 42.9 mmHg (ref 32.0–48.0)
pH, Arterial: 7.393 (ref 7.350–7.450)
pH, Arterial: 7.341 — ABNORMAL LOW (ref 7.350–7.450)
pO2, Arterial: 136 mmHg — ABNORMAL HIGH (ref 83.0–108.0)
pO2, Arterial: 80 mmHg — ABNORMAL LOW (ref 83.0–108.0)
pO2, Arterial: 97 mmHg (ref 83.0–108.0)

## 2017-01-11 LAB — SURGICAL PCR SCREEN
MRSA, PCR: NEGATIVE
Staphylococcus aureus: NEGATIVE

## 2017-01-11 LAB — APTT
aPTT: 33 seconds (ref 24–36)
aPTT: 29 seconds (ref 24–36)

## 2017-01-11 LAB — CBC
HCT: 40.2 % (ref 39.0–52.0)
HCT: 37.7 % — ABNORMAL LOW (ref 39.0–52.0)
HEMATOCRIT: 38.3 % — AB (ref 39.0–52.0)
Hemoglobin: 13.5 g/dL (ref 13.0–17.0)
Hemoglobin: 12.8 g/dL — ABNORMAL LOW (ref 13.0–17.0)
Hemoglobin: 12.6 g/dL — ABNORMAL LOW (ref 13.0–17.0)
MCH: 31.3 pg (ref 26.0–34.0)
MCH: 30.6 pg (ref 26.0–34.0)
MCH: 30.7 pg (ref 26.0–34.0)
MCHC: 33.6 g/dL (ref 30.0–36.0)
MCHC: 33.4 g/dL (ref 30.0–36.0)
MCHC: 33.4 g/dL (ref 30.0–36.0)
MCV: 93.1 fL (ref 78.0–100.0)
MCV: 91.6 fL (ref 78.0–100.0)
MCV: 92 fL (ref 78.0–100.0)
PLATELETS: 225 10*3/uL (ref 150–400)
Platelets: 158 10*3/uL (ref 150–400)
Platelets: 166 10*3/uL (ref 150–400)
RBC: 4.32 MIL/uL (ref 4.22–5.81)
RBC: 4.18 MIL/uL — ABNORMAL LOW (ref 4.22–5.81)
RBC: 4.1 MIL/uL — ABNORMAL LOW (ref 4.22–5.81)
RDW: 13.1 % (ref 11.5–15.5)
RDW: 12.9 % (ref 11.5–15.5)
RDW: 12.9 % (ref 11.5–15.5)
WBC: 7.4 10*3/uL (ref 4.0–10.5)
WBC: 18.6 10*3/uL — ABNORMAL HIGH (ref 4.0–10.5)
WBC: 17.1 10*3/uL — ABNORMAL HIGH (ref 4.0–10.5)

## 2017-01-11 LAB — CREATININE, SERUM
Creatinine, Ser: 0.94 mg/dL (ref 0.61–1.24)
GFR calc Af Amer: >60 mL/min (ref >60)
GFR calc non Af Amer: >60 mL/min (ref >60)

## 2017-01-11 LAB — PLATELET COUNT: Platelets: 157 10*3/uL (ref 150–400)

## 2017-01-11 LAB — PROTIME-INR
INR: 1.39
Prothrombin Time: 17.2 seconds — ABNORMAL HIGH (ref 11.4–15.2)

## 2017-01-11 LAB — GLUCOSE, CAPILLARY
GLUCOSE-CAPILLARY: 113 mg/dL — AB (ref 65–99)
GLUCOSE-CAPILLARY: 116 mg/dL — AB (ref 65–99)
GLUCOSE-CAPILLARY: 117 mg/dL — AB (ref 65–99)
GLUCOSE-CAPILLARY: 137 mg/dL — AB (ref 65–99)
GLUCOSE-CAPILLARY: 115 mg/dL — AB (ref 65–99)
Glucose-Capillary: 105 mg/dL — ABNORMAL HIGH (ref 65–99)
Glucose-Capillary: 113 mg/dL — ABNORMAL HIGH (ref 65–99)
Glucose-Capillary: 110 mg/dL — ABNORMAL HIGH (ref 65–99)

## 2017-01-11 LAB — HEMOGLOBIN AND HEMATOCRIT, BLOOD
HCT: 31.2 % — ABNORMAL LOW (ref 39.0–52.0)
Hemoglobin: 10.7 g/dL — ABNORMAL LOW (ref 13.0–17.0)

## 2017-01-11 LAB — MAGNESIUM: Magnesium: 3 mg/dL — ABNORMAL HIGH (ref 1.7–2.4)

## 2017-01-11 LAB — HEMOGLOBIN A1C
Hgb A1c MFr Bld: 5.3 % (ref 4.8–5.6)
Mean Plasma Glucose: 105

## 2017-01-11 SURGERY — CORONARY ARTERY BYPASS GRAFTING (CABG)
Anesthesia: General | Site: Leg Upper | Laterality: Right

## 2017-01-11 MED ORDER — PROTAMINE SULFATE 10 MG/ML IV SOLN
INTRAVENOUS | Status: DC | PRN
  Administered 2017-01-11: 350 mg via INTRAVENOUS

## 2017-01-11 MED ORDER — ASPIRIN 81 MG PO CHEW: 324.0000 mg | CHEWABLE_TABLET | Freq: Every day | ORAL | Status: DC

## 2017-01-11 MED ORDER — LACTATED RINGERS IV SOLN
INTRAVENOUS | Status: DC | PRN
  Administered 2017-01-11 (×2): via INTRAVENOUS

## 2017-01-11 MED ORDER — MIDAZOLAM HCL 5 MG/5ML IJ SOLN
INTRAMUSCULAR | Status: DC | PRN
  Administered 2017-01-11: 3 mg via INTRAVENOUS
  Administered 2017-01-11: 2 mg via INTRAVENOUS
  Administered 2017-01-11: 4 mg via INTRAVENOUS
  Administered 2017-01-11: 3 mg via INTRAVENOUS

## 2017-01-11 MED ORDER — ONDANSETRON HCL 4 MG/2ML IJ SOLN
4.0000 mg | Freq: Four times a day (QID) | INTRAMUSCULAR | Status: DC | PRN
  Administered 2017-01-12 (×3): 4 mg via INTRAVENOUS
  Filled 2017-01-11 (×3): qty 2

## 2017-01-11 MED ORDER — PANTOPRAZOLE SODIUM 40 MG PO TBEC: 40.0000 mg | DELAYED_RELEASE_TABLET | Freq: Every day | ORAL | Status: DC

## 2017-01-11 MED ORDER — SODIUM CHLORIDE 0.45 % IV SOLN
INTRAVENOUS | Status: DC | PRN
  Administered 2017-01-11: 14:00:00 via INTRAVENOUS

## 2017-01-11 MED ORDER — LIDOCAINE 2% (20 MG/ML) 5 ML SYRINGE
INTRAMUSCULAR | Status: DC | PRN
  Administered 2017-01-11: 100 mg via INTRAVENOUS

## 2017-01-11 MED ORDER — VANCOMYCIN HCL IN DEXTROSE 1-5 GM/200ML-% IV SOLN
1000.0000 mg | Freq: Once | INTRAVENOUS | Status: AC
  Administered 2017-01-11: 1000 mg via INTRAVENOUS
  Filled 2017-01-11: qty 200

## 2017-01-11 MED ORDER — SODIUM CHLORIDE 0.9 % IJ SOLN
OROMUCOSAL | Status: DC | PRN
  Administered 2017-01-11 (×3): 4 mL via TOPICAL

## 2017-01-11 MED ORDER — MIDAZOLAM HCL 2 MG/2ML IJ SOLN
INTRAMUSCULAR | Status: AC
Start: 2017-01-11 — End: 2017-01-11
  Filled 2017-01-11: qty 2

## 2017-01-11 MED ORDER — ASPIRIN EC 325 MG PO TBEC
325.0000 mg | DELAYED_RELEASE_TABLET | Freq: Every day | ORAL | Status: DC
  Administered 2017-01-12: 325 mg via ORAL
  Filled 2017-01-11: qty 1

## 2017-01-11 MED ORDER — METOPROLOL TARTRATE 25 MG/10 ML ORAL SUSPENSION: 12.5000 mg | Freq: Two times a day (BID) | ORAL | Status: DC

## 2017-01-11 MED ORDER — SODIUM CHLORIDE 0.9% FLUSH: 3.0000 mL | INTRAVENOUS | Status: DC | PRN

## 2017-01-11 MED ORDER — PROPOFOL 10 MG/ML IV BOLUS
INTRAVENOUS | Status: AC
  Filled 2017-01-11: qty 20

## 2017-01-11 MED ORDER — MAGNESIUM SULFATE 4 GM/100ML IV SOLN
4.0000 g | Freq: Once | INTRAVENOUS | Status: AC
  Administered 2017-01-11: 4 g via INTRAVENOUS
  Filled 2017-01-11: qty 100

## 2017-01-11 MED ORDER — MORPHINE SULFATE (PF) 2 MG/ML IV SOLN
1.0000 mg | INTRAVENOUS | Status: AC | PRN
  Administered 2017-01-11 (×2): 2 mg via INTRAVENOUS
  Administered 2017-01-11: 4 mg via INTRAVENOUS
  Administered 2017-01-11 – 2017-01-12 (×2): 2 mg via INTRAVENOUS
  Filled 2017-01-11 (×3): qty 1
  Filled 2017-01-11: qty 2
  Filled 2017-01-11 (×2): qty 1

## 2017-01-11 MED ORDER — HEPARIN SODIUM (PORCINE) 1000 UNIT/ML IJ SOLN
INTRAMUSCULAR | Status: DC | PRN
  Administered 2017-01-11: 40000 [IU] via INTRAVENOUS

## 2017-01-11 MED ORDER — ALBUMIN HUMAN 5 % IV SOLN
250.0000 mL | INTRAVENOUS | Status: AC | PRN
  Administered 2017-01-11 (×2): 250 mL via INTRAVENOUS

## 2017-01-11 MED ORDER — METOPROLOL TARTRATE 12.5 MG HALF TABLET
12.5000 mg | ORAL_TABLET | Freq: Two times a day (BID) | ORAL | Status: DC
  Administered 2017-01-12 (×2): 12.5 mg via ORAL
  Filled 2017-01-11 (×2): qty 1

## 2017-01-11 MED ORDER — MIDAZOLAM HCL 10 MG/2ML IJ SOLN
INTRAMUSCULAR | Status: AC
  Filled 2017-01-11: qty 2

## 2017-01-11 MED ORDER — LACTATED RINGERS IV SOLN
INTRAVENOUS | Status: DC
  Administered 2017-01-11 – 2017-01-12 (×2): via INTRAVENOUS

## 2017-01-11 MED ORDER — LACTATED RINGERS IV SOLN: 500.0000 mL | Freq: Once | INTRAVENOUS | Status: DC | PRN

## 2017-01-11 MED ORDER — FENTANYL CITRATE (PF) 250 MCG/5ML IJ SOLN
INTRAMUSCULAR | Status: DC | PRN
  Administered 2017-01-11 (×2): 200 ug via INTRAVENOUS
  Administered 2017-01-11: 250 ug via INTRAVENOUS
  Administered 2017-01-11: 100 ug via INTRAVENOUS
  Administered 2017-01-11: 50 ug via INTRAVENOUS
  Administered 2017-01-11: 150 ug via INTRAVENOUS
  Administered 2017-01-11: 200 ug via INTRAVENOUS

## 2017-01-11 MED ORDER — ACETAMINOPHEN 160 MG/5ML PO SOLN: 1000.0000 mg | Freq: Four times a day (QID) | ORAL | Status: DC

## 2017-01-11 MED ORDER — ACETAMINOPHEN 500 MG PO TABS
1000.0000 mg | ORAL_TABLET | Freq: Four times a day (QID) | ORAL | Status: DC
  Administered 2017-01-12 – 2017-01-13 (×4): 1000 mg via ORAL
  Filled 2017-01-11 (×4): qty 2

## 2017-01-11 MED ORDER — LACTATED RINGERS IV SOLN
INTRAVENOUS | Status: DC | PRN
  Administered 2017-01-11: 07:00:00 via INTRAVENOUS

## 2017-01-11 MED ORDER — INSULIN REGULAR BOLUS VIA INFUSION
0.0000 [IU] | Freq: Three times a day (TID) | INTRAVENOUS | Status: DC
Start: 2017-01-11 — End: 2017-01-13
  Filled 2017-01-11: qty 10

## 2017-01-11 MED ORDER — BISACODYL 5 MG PO TBEC
10.0000 mg | DELAYED_RELEASE_TABLET | Freq: Every day | ORAL | Status: DC
  Administered 2017-01-12: 10 mg via ORAL
  Filled 2017-01-11: qty 2

## 2017-01-11 MED ORDER — FENTANYL CITRATE (PF) 250 MCG/5ML IJ SOLN
INTRAMUSCULAR | Status: AC
  Filled 2017-01-11: qty 25

## 2017-01-11 MED ORDER — SODIUM CHLORIDE 0.9 % IV SOLN
0.0000 ug/min | INTRAVENOUS | Status: DC
  Filled 2017-01-11: qty 2

## 2017-01-11 MED ORDER — HEMOSTATIC AGENTS (NO CHARGE) OPTIME
TOPICAL | Status: DC | PRN
  Administered 2017-01-11: 1 via TOPICAL

## 2017-01-11 MED ORDER — SODIUM CHLORIDE 0.9 % IV SOLN
INTRAVENOUS | Status: DC
  Filled 2017-01-11 (×2): qty 2.5

## 2017-01-11 MED ORDER — OXYCODONE HCL 5 MG PO TABS
5.0000 mg | ORAL_TABLET | ORAL | Status: DC | PRN
  Administered 2017-01-12 – 2017-01-13 (×6): 10 mg via ORAL
  Filled 2017-01-11 (×6): qty 2

## 2017-01-11 MED ORDER — LACTATED RINGERS IV SOLN: INTRAVENOUS | Status: DC

## 2017-01-11 MED ORDER — SODIUM CHLORIDE 0.9 % IV SOLN: 250.0000 mL | INTRAVENOUS | Status: DC

## 2017-01-11 MED ORDER — MIDAZOLAM HCL 2 MG/2ML IJ SOLN: 2.0000 mg | INTRAMUSCULAR | Status: DC | PRN

## 2017-01-11 MED ORDER — FAMOTIDINE IN NACL 20-0.9 MG/50ML-% IV SOLN
20.0000 mg | Freq: Two times a day (BID) | INTRAVENOUS | Status: AC
  Administered 2017-01-12: 20 mg via INTRAVENOUS
  Filled 2017-01-11: qty 50

## 2017-01-11 MED ORDER — DOCUSATE SODIUM 100 MG PO CAPS
200.0000 mg | ORAL_CAPSULE | Freq: Every day | ORAL | Status: DC
  Administered 2017-01-12: 200 mg via ORAL
  Filled 2017-01-11: qty 2

## 2017-01-11 MED ORDER — PROPOFOL 10 MG/ML IV BOLUS
INTRAVENOUS | Status: DC | PRN
  Administered 2017-01-11: 70 mg via INTRAVENOUS

## 2017-01-11 MED ORDER — ACETAMINOPHEN 160 MG/5ML PO SOLN: 650.0000 mg | Freq: Once | ORAL | Status: AC

## 2017-01-11 MED ORDER — TRAMADOL HCL 50 MG PO TABS: 50.0000 mg | ORAL_TABLET | ORAL | Status: DC | PRN

## 2017-01-11 MED ORDER — NITROGLYCERIN IN D5W 200-5 MCG/ML-% IV SOLN: 0.0000 ug/min | INTRAVENOUS | Status: DC

## 2017-01-11 MED ORDER — 0.9 % SODIUM CHLORIDE (POUR BTL) OPTIME
TOPICAL | Status: DC | PRN
  Administered 2017-01-11: 5000 mL

## 2017-01-11 MED ORDER — DEXMEDETOMIDINE HCL IN NACL 200 MCG/50ML IV SOLN
0.0000 ug/kg/h | INTRAVENOUS | Status: DC
Start: 2017-01-11 — End: 2017-01-13
  Administered 2017-01-11: 0.1 ug/kg/h via INTRAVENOUS

## 2017-01-11 MED ORDER — SODIUM CHLORIDE 0.9% FLUSH
3.0000 mL | Freq: Two times a day (BID) | INTRAVENOUS | Status: DC
  Administered 2017-01-12: 3 mL via INTRAVENOUS

## 2017-01-11 MED ORDER — CHLORHEXIDINE GLUCONATE 0.12 % MT SOLN
15.0000 mL | OROMUCOSAL | Status: AC
  Administered 2017-01-11: 15 mL via OROMUCOSAL

## 2017-01-11 MED ORDER — SODIUM CHLORIDE 0.9 % IV SOLN
30.0000 meq | Freq: Once | INTRAVENOUS | Status: DC
  Filled 2017-01-11: qty 15

## 2017-01-11 MED ORDER — MORPHINE SULFATE (PF) 2 MG/ML IV SOLN
2.0000 mg | INTRAVENOUS | Status: DC | PRN
  Administered 2017-01-12 (×4): 2 mg via INTRAVENOUS
  Filled 2017-01-11 (×3): qty 1

## 2017-01-11 MED ORDER — SODIUM CHLORIDE 0.9 % IV SOLN
INTRAVENOUS | Status: DC
  Administered 2017-01-11: 14:00:00 via INTRAVENOUS

## 2017-01-11 MED ORDER — VECURONIUM BROMIDE 10 MG IV SOLR
INTRAVENOUS | Status: DC | PRN
  Administered 2017-01-11 (×6): 5 mg via INTRAVENOUS

## 2017-01-11 MED ORDER — DEXAMETHASONE SODIUM PHOSPHATE 4 MG/ML IJ SOLN
INTRAMUSCULAR | Status: DC | PRN
  Administered 2017-01-11: 10 mg via INTRAVENOUS

## 2017-01-11 MED ORDER — METOPROLOL TARTRATE 5 MG/5ML IV SOLN: 2.5000 mg | INTRAVENOUS | Status: DC | PRN

## 2017-01-11 MED ORDER — BISACODYL 10 MG RE SUPP: 10.0000 mg | Freq: Every day | RECTAL | Status: DC

## 2017-01-11 MED ORDER — ALBUMIN HUMAN 5 % IV SOLN
INTRAVENOUS | Status: DC | PRN
  Administered 2017-01-11: 12:00:00 via INTRAVENOUS

## 2017-01-11 MED ORDER — DEXTROSE 5 % IV SOLN
1.5000 g | Freq: Two times a day (BID) | INTRAVENOUS | Status: DC
  Administered 2017-01-12 (×3): 1.5 g via INTRAVENOUS
  Filled 2017-01-11 (×4): qty 1.5

## 2017-01-11 MED ORDER — ROCURONIUM BROMIDE 100 MG/10ML IV SOLN
INTRAVENOUS | Status: DC | PRN
  Administered 2017-01-11: 50 mg via INTRAVENOUS

## 2017-01-11 MED ORDER — ACETAMINOPHEN 650 MG RE SUPP
650.0000 mg | Freq: Once | RECTAL | Status: AC
  Administered 2017-01-11: 650 mg via RECTAL

## 2017-01-11 MED FILL — Heparin Sodium (Porcine) Inj 1000 Unit/ML: INTRAMUSCULAR | Qty: 30 | Status: AC

## 2017-01-11 MED FILL — Magnesium Sulfate Inj 50%: INTRAMUSCULAR | Qty: 10 | Status: AC

## 2017-01-11 MED FILL — Potassium Chloride Inj 2 mEq/ML: INTRAVENOUS | Qty: 40 | Status: CN

## 2017-01-11 MED FILL — Potassium Chloride Inj 2 mEq/ML: INTRAVENOUS | Qty: 40 | Status: AC

## 2017-01-11 MED FILL — Heparin Sodium (Porcine) Inj 1000 Unit/ML: INTRAMUSCULAR | Qty: 30 | Status: CN

## 2017-01-11 SURGICAL SUPPLY — 78 items
BAG DECANTER FOR FLEXI CONT (MISCELLANEOUS) ×5 IMPLANT
BANDAGE ACE 4X5 VEL STRL LF (GAUZE/BANDAGES/DRESSINGS) IMPLANT
BANDAGE ACE 6X5 VEL STRL LF (GAUZE/BANDAGES/DRESSINGS) IMPLANT
BANDAGE ELASTIC 4 VELCRO ST LF (GAUZE/BANDAGES/DRESSINGS) ×5 IMPLANT
BANDAGE ELASTIC 6 VELCRO ST LF (GAUZE/BANDAGES/DRESSINGS) ×5 IMPLANT
BLADE STERNUM SYSTEM 6 (BLADE) ×5 IMPLANT
BLADE SURG 11 STRL SS (BLADE) ×5 IMPLANT
BNDG GAUZE ELAST 4 BULKY (GAUZE/BANDAGES/DRESSINGS) ×5 IMPLANT
CANISTER SUCT 3000ML PPV (MISCELLANEOUS) ×5 IMPLANT
CATH CPB KIT GERHARDT (MISCELLANEOUS) ×5 IMPLANT
CATH THORACIC 28FR (CATHETERS) ×5 IMPLANT
CRADLE DONUT ADULT HEAD (MISCELLANEOUS) ×5 IMPLANT
DERMABOND ADVANCED (GAUZE/BANDAGES/DRESSINGS) ×1
DERMABOND ADVANCED .7 DNX12 (GAUZE/BANDAGES/DRESSINGS) ×4 IMPLANT
DRAIN CHANNEL 28F RND 3/8 FF (WOUND CARE) ×5 IMPLANT
DRAPE CARDIOVASCULAR INCISE (DRAPES) ×1
DRAPE SLUSH/WARMER DISC (DRAPES) ×5 IMPLANT
DRAPE SRG 135X102X78XABS (DRAPES) ×4 IMPLANT
DRSG AQUACEL AG ADV 3.5X14 (GAUZE/BANDAGES/DRESSINGS) ×5 IMPLANT
ELECT BLADE 4.0 EZ CLEAN MEGAD (MISCELLANEOUS) ×10
ELECT REM PT RETURN 9FT ADLT (ELECTROSURGICAL) ×10
ELECTRODE BLDE 4.0 EZ CLN MEGD (MISCELLANEOUS) ×8 IMPLANT
ELECTRODE REM PT RTRN 9FT ADLT (ELECTROSURGICAL) ×8 IMPLANT
FELT TEFLON 1X6 (MISCELLANEOUS) ×10 IMPLANT
GAUZE SPONGE 4X4 12PLY STRL (GAUZE/BANDAGES/DRESSINGS) IMPLANT
GLOVE BIO SURGEON STRL SZ 6 (GLOVE) ×15 IMPLANT
GLOVE BIO SURGEON STRL SZ 6.5 (GLOVE) ×25 IMPLANT
GLOVE BIOGEL PI IND STRL 6 (GLOVE) ×8 IMPLANT
GLOVE BIOGEL PI IND STRL 6.5 (GLOVE) ×12 IMPLANT
GLOVE BIOGEL PI INDICATOR 6 (GLOVE) ×2
GLOVE BIOGEL PI INDICATOR 6.5 (GLOVE) ×3
GOWN STRL REUS W/ TWL LRG LVL3 (GOWN DISPOSABLE) ×16 IMPLANT
GOWN STRL REUS W/TWL LRG LVL3 (GOWN DISPOSABLE) ×4
HEMOSTAT POWDER SURGIFOAM 1G (HEMOSTASIS) ×15 IMPLANT
HEMOSTAT SURGICEL 2X14 (HEMOSTASIS) ×5 IMPLANT
KIT BASIN OR (CUSTOM PROCEDURE TRAY) ×5 IMPLANT
KIT CATH SUCT 8FR (CATHETERS) ×5 IMPLANT
KIT ROOM TURNOVER OR (KITS) ×5 IMPLANT
KIT SUCTION CATH 14FR (SUCTIONS) ×10 IMPLANT
KIT VASOVIEW HEMOPRO VH 3000 (KITS) ×5 IMPLANT
LEAD PACING MYOCARDI (MISCELLANEOUS) ×5 IMPLANT
MARKER GRAFT CORONARY BYPASS (MISCELLANEOUS) ×15 IMPLANT
NS IRRIG 1000ML POUR BTL (IV SOLUTION) ×25 IMPLANT
PACK OPEN HEART (CUSTOM PROCEDURE TRAY) ×5 IMPLANT
PAD ARMBOARD 7.5X6 YLW CONV (MISCELLANEOUS) ×10 IMPLANT
PAD ELECT DEFIB RADIOL ZOLL (MISCELLANEOUS) ×5 IMPLANT
PENCIL BUTTON HOLSTER BLD 10FT (ELECTRODE) ×5 IMPLANT
SET CARDIOPLEGIA MPS 5001102 (MISCELLANEOUS) ×5 IMPLANT
SPONGE GAUZE 4X4 12PLY STER LF (GAUZE/BANDAGES/DRESSINGS) ×10 IMPLANT
SURGIFLO W/THROMBIN 8M KIT (HEMOSTASIS) ×5 IMPLANT
SUT BONE WAX W31G (SUTURE) ×5 IMPLANT
SUT PROLENE 3 0 SH1 36 (SUTURE) ×10 IMPLANT
SUT PROLENE 4 0 TF (SUTURE) ×10 IMPLANT
SUT PROLENE 6 0 C 1 30 (SUTURE) ×10 IMPLANT
SUT PROLENE 6 0 CC (SUTURE) ×15 IMPLANT
SUT PROLENE 7 0 BV1 MDA (SUTURE) ×10 IMPLANT
SUT PROLENE 7.0 RB 3 (SUTURE) ×10 IMPLANT
SUT PROLENE 8 0 BV175 6 (SUTURE) ×20 IMPLANT
SUT SILK 2 0 SH CR/8 (SUTURE) ×5 IMPLANT
SUT STEEL 6MS V (SUTURE) ×5 IMPLANT
SUT STEEL SZ 6 DBL 3X14 BALL (SUTURE) ×5 IMPLANT
SUT VIC AB 1 CTX 18 (SUTURE) ×10 IMPLANT
SUT VIC AB 2-0 CT1 27 (SUTURE) ×1
SUT VIC AB 2-0 CT1 TAPERPNT 27 (SUTURE) ×4 IMPLANT
SUT VIC AB 4-0 PS2 27 (SUTURE) ×5 IMPLANT
SUTURE E-PAK OPEN HEART (SUTURE) ×5 IMPLANT
SYSTEM SAHARA CHEST DRAIN ATS (WOUND CARE) ×5 IMPLANT
TAPE CLOTH SURG 4X10 WHT LF (GAUZE/BANDAGES/DRESSINGS) ×10 IMPLANT
TOWEL GREEN STERILE (TOWEL DISPOSABLE) ×20 IMPLANT
TOWEL GREEN STERILE FF (TOWEL DISPOSABLE) ×10 IMPLANT
TOWEL OR 17X24 6PK STRL BLUE (TOWEL DISPOSABLE) ×10 IMPLANT
TOWEL OR 17X26 10 PK STRL BLUE (TOWEL DISPOSABLE) ×10 IMPLANT
TRAY CATH LUMEN 1 20CM STRL (SET/KITS/TRAYS/PACK) ×5 IMPLANT
TRAY FOLEY IC TEMP SENS 16FR (CATHETERS) ×5 IMPLANT
TUBING ART PRESS 48 MALE/FEM (TUBING) ×10 IMPLANT
TUBING INSUFFLATION (TUBING) ×5 IMPLANT
UNDERPAD 30X30 (UNDERPADS AND DIAPERS) ×5 IMPLANT
WATER STERILE IRR 1000ML POUR (IV SOLUTION) ×10 IMPLANT

## 2017-01-11 NOTE — Procedures (Signed)
Extubation Procedure Note  Patient Details:   Name: [name] DOB: 1951-09-06 MRN: [id number]   Airway Documentation:  Positive air cuff leak test prior to extubation.    Evaluation  O2 sats: stable throughout Complications: No apparent complications Patient did tolerate procedure well. Bilateral Breath Sounds: Clear   Yes, patient able to speak, no stridor noted, no distress noted.  [name], [name] 01/11/2017, 5:38 PM

## 2017-01-11 NOTE — Transfer of Care (Signed)
Immediate Anesthesia Transfer of Care Note  Patient: [name]  Procedure(s) Performed: Procedure(s): CORONARY ARTERY BYPASS GRAFTING (CABG) x 4 (LIMA to LAD, SVG to DIAGONAL, SVG to OM, SVG to RCA) with ENDOVEIN HARVEST OF RIGHT GREATER SAPHENOUS VEIN (N/A) TRANSESOPHAGEAL ECHOCARDIOGRAM (TEE) (N/A) ENDOVEIN HARVEST OF GREATER SAPHENOUS VEIN (Right) FEMORAL ARTERIAL LINE INSERTION ;16 GAUGE (Right)  Patient Location: SICU  Anesthesia Type:General  Level of Consciousness: sedated and Patient remains intubated per anesthesia plan  Airway & Oxygen Therapy: Patient remains intubated per anesthesia plan and Patient placed on Ventilator (see vital sign flow sheet for setting)  Post-op Assessment: Report given to RN and Post -op Vital signs reviewed and stable  Post vital signs: Reviewed and stable  Last Vitals:  Vitals:   01/10/17 1954 01/11/17 0500  BP: 134/74 125/78  Pulse: 72 68  Resp: 18 18  Temp: 37 C 37 C    Last Pain:  Vitals:   01/11/17 0500  TempSrc: Oral  PainSc:          Complications: No apparent anesthesia complications

## 2017-01-11 NOTE — Anesthesia Postprocedure Evaluation (Addendum)
Anesthesia Post Note  Patient: [name]  Procedure(s) Performed: Procedure(s) (LRB): CORONARY ARTERY BYPASS GRAFTING (CABG) x 4 (LIMA to LAD, SVG to DIAGONAL, SVG to OM, SVG to RCA) with ENDOVEIN HARVEST OF RIGHT GREATER SAPHENOUS VEIN (N/A) TRANSESOPHAGEAL ECHOCARDIOGRAM (TEE) (N/A) ENDOVEIN HARVEST OF GREATER SAPHENOUS VEIN (Right) FEMORAL ARTERIAL LINE INSERTION ;16 GAUGE (Right)  Patient location during evaluation: SICU Anesthesia Type: General Level of consciousness: sedated and patient remains intubated per anesthesia plan Pain management: pain level controlled Vital Signs Assessment: post-procedure vital signs reviewed and stable Respiratory status: patient remains intubated per anesthesia plan Cardiovascular status: stable Anesthetic complications: no       Last Vitals:  Vitals:   01/11/17 1345 01/11/17 1400  BP:  101/65  Pulse: 90 80  Resp: 11 12  Temp: 36.8 C 36.8 C    Last Pain:  Vitals:   01/11/17 1400  TempSrc: Core (Comment)  PainSc:                  Syed Zukas,JAMES TERRILL

## 2017-01-11 NOTE — Anesthesia Procedure Notes (Addendum)
Central Venous Catheter Insertion Performed by: Lewie LoronGERMEROTH, Celia Friedland, anesthesiologist Patient location: Pre-op. Preanesthetic checklist: patient identified, IV checked, site marked, risks and benefits discussed, surgical consent, monitors and equipment checked, pre-op evaluation, timeout performed and anesthesia consent Position: Trendelenburg Lidocaine 1% used for infiltration and patient sedated Hand hygiene performed  and maximum sterile barriers used  Catheter size: 9 Fr PA cath and Central line was placed.Sheath introducer Swan type:oximetry PA Cath depth:48 Procedure performed using ultrasound guided technique. Ultrasound Notes:anatomy identified, needle tip was noted to be adjacent to the nerve/plexus identified, no ultrasound evidence of intravascular and/or intraneural injection and image(s) printed for medical record Attempts: 1 Following insertion, line sutured and dressing applied. Post procedure assessment: blood return through all ports, free fluid flow and no air  Patient tolerated the procedure well with no immediate complications.

## 2017-01-11 NOTE — Brief Op Note (Addendum)
      [address]       [place] [id number]             [phone]       01/11/2017  1:25 PM  PATIENT:  [name]  66 y.o. male  PRE-OPERATIVE DIAGNOSIS:  CAD  POST-OPERATIVE DIAGNOSIS:  CAD  PROCEDURE: TRANSESOPHAGEAL ECHOCARDIOGRAM (TEE), MEDIAN STERNOTOMY for CORONARY ARTERY BYPASS GRAFTING (CABG) x 4 (LIMA to LAD, SVG to DIAGONAL, SVG to OM, SVG to RCA) with ENDOVEIN HARVEST OF RIGHT GREATER SAPHENOUS VEIN   SURGEON:  Surgeon(s) and Role:    * Delight OvensEdward B Ziere Docken, MD - Primary  PHYSICIAN ASSISTANT: Doree Fudgeonielle Zimmerman PA-C  ANESTHESIA:   general  EBL:  Total I/O In: 2350 [I.V.:1300; Blood:800; IV Piggyback:250] Out: 2700 [Urine:1500; Blood:1200]  DRAINS: Chest tubes placed in the mediastinal and pleural spaces   COUNTS CORRECT:  YES  DICTATION: .Dragon Dictation  PLAN OF CARE: Admit to inpatient   PATIENT DISPOSITION:  ICU - intubated and hemodynamically stable.   Delay start of Pharmacological VTE agent (>24hrs) due to surgical blood loss or risk of bleeding: yes  BASELINE WEIGHT: 97 kg

## 2017-01-11 NOTE — Progress Notes (Signed)
Pre Procedure note for inpatients:   [name] has been scheduled for Procedure(s): CORONARY ARTERY BYPASS GRAFTING (CABG) (N/A) TRANSESOPHAGEAL ECHOCARDIOGRAM (TEE) (N/A) today. The various methods of treatment have been discussed with the patient. After consideration of the risks, benefits and treatment options the patient has consented to the planned procedure.   The patient has been seen and labs reviewed. There are no changes in the patient's condition to prevent proceeding with the planned procedure today.  Recent labs:  Lab Results  Component Value Date   WBC 7.4 01/11/2017   HGB 13.5 01/11/2017   HCT 40.2 01/11/2017   PLT 225 01/11/2017   GLUCOSE 107 (H) 01/11/2017   ALT 25 01/10/2017   AST 27 01/10/2017   NA 136 01/11/2017   K 3.7 01/11/2017   CL 102 01/11/2017   CREATININE 0.95 01/11/2017   BUN 13 01/11/2017   CO2 26 01/11/2017   INR 1.11 01/10/2017   HGBA1C 5.3 01/10/2017    Delight OvensEdward B Kamir Selover, MD 01/11/2017 7:06 AM  .ip

## 2017-01-11 NOTE — Anesthesia Preprocedure Evaluation (Signed)
Anesthesia Evaluation  Patient identified by MRN, date of birth, ID band Patient awake    Reviewed: Allergy & Precautions, NPO status , Patient's Chart, lab work & pertinent test results  Airway Mallampati: I  TM Distance: >3 FB Neck ROM: Full    Dental  (+) Teeth Intact   Pulmonary neg pulmonary ROS,    breath sounds clear to auscultation       Cardiovascular hypertension, + angina with exertion  Rhythm:Regular Rate:Normal     Neuro/Psych negative neurological ROS     GI/Hepatic negative GI ROS, Neg liver ROS,   Endo/Other  negative endocrine ROS  Renal/GU      Musculoskeletal negative musculoskeletal ROS (+)   Abdominal   Peds  Hematology negative hematology ROS (+)   Anesthesia Other Findings   Reproductive/Obstetrics                             Anesthesia Physical Anesthesia Plan  ASA: III  Anesthesia Plan: General   Post-op Pain Management:    Induction: Intravenous  Airway Management Planned: Oral ETT  Additional Equipment: TEE, Arterial line, PA Cath and Ultrasound Guidance Line Placement  Intra-op Plan:   Post-operative Plan: Post-operative intubation/ventilation  Informed Consent: I have reviewed the patients History and Physical, chart, labs and discussed the procedure including the risks, benefits and alternatives for the proposed anesthesia with the patient or authorized representative who has indicated his/her understanding and acceptance.   Dental advisory given  Plan Discussed with: CRNA  Anesthesia Plan Comments:         Anesthesia Quick Evaluation

## 2017-01-11 NOTE — OR Nursing (Signed)
1252 Second call made to SICU. Made aware pt has right femoral A-Line.

## 2017-01-11 NOTE — OR Nursing (Signed)
1216 First call made to SICU.

## 2017-01-11 NOTE — Progress Notes (Signed)
Patient ID: Tyler FinesVictor Macomber Cole, male   DOB: January 08, 1951, 66 y.o.   MRN: [id number] EVENING ROUNDS NOTE :     [address]       [place] [id number]             [phone]                 Day of Surgery Procedure(s) (LRB): CORONARY ARTERY BYPASS GRAFTING (CABG) x 4 (LIMA to LAD, SVG to DIAGONAL, SVG to OM, SVG to RCA) with ENDOVEIN HARVEST OF RIGHT GREATER SAPHENOUS VEIN (N/A) TRANSESOPHAGEAL ECHOCARDIOGRAM (TEE) (N/A) ENDOVEIN HARVEST OF GREATER SAPHENOUS VEIN (Right) FEMORAL ARTERIAL LINE INSERTION ;16 GAUGE (Right)  Total Length of Stay:  LOS: 2 days  BP 109/75   Pulse 90   Temp 98.8 F (37.1 C) (Core (Comment))   Resp (!) 22   Ht 6\' 2"  (1.88 m)   Wt 213 lb 3.2 oz (96.7 kg)   SpO2 99%   BMI 27.37 kg/m   .Intake/Output      02/22 0701 - 02/23 0700 02/23 0701 - 02/24 0700   P.O. 720    I.V. (mL/kg)  2445.4 (25.3)   Blood  800   NG/GT  30   IV Piggyback  850   Total Intake(mL/kg) 720 (7.4) 4125.4 (42.7)   Urine (mL/kg/hr) 2975 (1.3) 1830 (1.7)   Emesis/NG output  80 (0.1)   Blood  1200 (1.1)   Chest Tube  80 (0.1)   Total Output 2975 3190   Net -2255 +935.4          . sodium chloride 20 mL/hr at 01/11/17 1352  . [START ON 01/12/2017] sodium chloride    . sodium chloride 20 mL/hr at 01/11/17 1351  . dexmedetomidine Stopped (01/11/17 1500)  . insulin (NOVOLIN-R) infusion 2.2 Units/hr (01/11/17 1745)  . lactated ringers 20 mL/hr at 01/11/17 1345  . lactated ringers 20 mL/hr at 01/11/17 1345  . nitroGLYCERIN Stopped (01/11/17 1345)  . phenylephrine (NEO-SYNEPHRINE) Adult infusion 15 mcg/min (01/11/17 1355)     Lab Results  Component Value Date   WBC 18.6 (H) 01/11/2017   HGB 12.2 (L) 01/11/2017   HCT 36.0 (L) 01/11/2017   PLT 158 01/11/2017   GLUCOSE 123 (H) 01/11/2017   ALT 25 01/10/2017   AST 27 01/10/2017   NA 140 01/11/2017   K 4.3 01/11/2017   CL 102 01/11/2017   CREATININE 0.90 01/11/2017   BUN 12 01/11/2017   CO2 26 01/11/2017     INR 1.39 01/11/2017   HGBA1C 5.3 01/10/2017   Stable postop, extubated neuro intact Not bleeding  Delight OvensEdward B Luci Bellucci MD  Beeper [phone] Office [phone] 01/11/2017 5:53 PM

## 2017-01-11 NOTE — Progress Notes (Signed)
Echocardiogram Echocardiogram Transesophageal has been performed.  Ziggy Reveles N Iniya Matzek 01/11/2017, 8:38 AM 

## 2017-01-11 NOTE — OR Nursing (Signed)
1321 Rolling call made to SICU.

## 2017-01-11 NOTE — Anesthesia Procedure Notes (Signed)
Procedure Name: Intubation Date/Time: 01/11/2017 7:43 AM Performed by: Oletta Lamas Pre-anesthesia Checklist: Patient identified, Emergency Drugs available, Suction available and Patient being monitored Patient Re-evaluated:Patient Re-evaluated prior to inductionOxygen Delivery Method: Circle System Utilized Preoxygenation: Pre-oxygenation with 100% oxygen Intubation Type: IV induction Ventilation: Mask ventilation without difficulty Laryngoscope Size: Mac and 3 Grade View: Grade I Tube type: Oral Number of attempts: 1 Airway Equipment and Method: Stylet Placement Confirmation: ETT inserted through vocal cords under direct vision,  positive ETCO2 and breath sounds checked- equal and bilateral Secured at: 24 cm Tube secured with: Tape Dental Injury: Teeth and Oropharynx as per pre-operative assessment

## 2017-01-12 ENCOUNTER — Inpatient Hospital Stay (HOSPITAL_COMMUNITY): Payer: Medicare Other

## 2017-01-12 LAB — GLUCOSE, CAPILLARY
GLUCOSE-CAPILLARY: 105 mg/dL — AB (ref 65–99)
GLUCOSE-CAPILLARY: 106 mg/dL — AB (ref 65–99)
GLUCOSE-CAPILLARY: 113 mg/dL — AB (ref 65–99)
GLUCOSE-CAPILLARY: 114 mg/dL — AB (ref 65–99)
GLUCOSE-CAPILLARY: 116 mg/dL — AB (ref 65–99)
GLUCOSE-CAPILLARY: 116 mg/dL — AB (ref 65–99)
GLUCOSE-CAPILLARY: 117 mg/dL — AB (ref 65–99)
GLUCOSE-CAPILLARY: 119 mg/dL — AB (ref 65–99)
GLUCOSE-CAPILLARY: 120 mg/dL — AB (ref 65–99)
GLUCOSE-CAPILLARY: 125 mg/dL — AB (ref 65–99)
Glucose-Capillary: 112 mg/dL — ABNORMAL HIGH (ref 65–99)
Glucose-Capillary: 114 mg/dL — ABNORMAL HIGH (ref 65–99)
Glucose-Capillary: 114 mg/dL — ABNORMAL HIGH (ref 65–99)
Glucose-Capillary: 117 mg/dL — ABNORMAL HIGH (ref 65–99)
Glucose-Capillary: 117 mg/dL — ABNORMAL HIGH (ref 65–99)
Glucose-Capillary: 119 mg/dL — ABNORMAL HIGH (ref 65–99)
Glucose-Capillary: 120 mg/dL — ABNORMAL HIGH (ref 65–99)
Glucose-Capillary: 124 mg/dL — ABNORMAL HIGH (ref 65–99)

## 2017-01-12 LAB — CBC
HEMATOCRIT: 35.4 % — AB (ref 39.0–52.0)
HEMATOCRIT: 38.8 % — AB (ref 39.0–52.0)
HEMOGLOBIN: 12 g/dL — AB (ref 13.0–17.0)
HEMOGLOBIN: 12.8 g/dL — AB (ref 13.0–17.0)
MCH: 31.3 pg (ref 26.0–34.0)
MCH: 31.2 pg (ref 26.0–34.0)
MCHC: 33.9 g/dL (ref 30.0–36.0)
MCHC: 33 g/dL (ref 30.0–36.0)
MCV: 92.2 fL (ref 78.0–100.0)
MCV: 94.6 fL (ref 78.0–100.0)
Platelets: 141 10*3/uL — ABNORMAL LOW (ref 150–400)
Platelets: 180 10*3/uL (ref 150–400)
RBC: 3.84 MIL/uL — ABNORMAL LOW (ref 4.22–5.81)
RBC: 4.1 MIL/uL — ABNORMAL LOW (ref 4.22–5.81)
RDW: 13.2 % (ref 11.5–15.5)
RDW: 13.5 % (ref 11.5–15.5)
WBC: 14.7 10*3/uL — ABNORMAL HIGH (ref 4.0–10.5)
WBC: 19.3 10*3/uL — ABNORMAL HIGH (ref 4.0–10.5)

## 2017-01-12 LAB — BASIC METABOLIC PANEL
ANION GAP: 10 (ref 5–15)
BUN: 15 mg/dL (ref 6–20)
CHLORIDE: 105 mmol/L (ref 101–111)
CO2: 22 mmol/L (ref 22–32)
Calcium: 8.1 mg/dL — ABNORMAL LOW (ref 8.9–10.3)
Creatinine, Ser: 0.71 mg/dL (ref 0.61–1.24)
GFR calc non Af Amer: >60 mL/min (ref >60)
GLUCOSE: 109 mg/dL — AB (ref 65–99)
Potassium: 4.4 mmol/L (ref 3.5–5.1)
Sodium: 137 mmol/L (ref 135–145)

## 2017-01-12 LAB — POCT I-STAT, CHEM 8
BUN: 19 mg/dL (ref 6–20)
Calcium, Ion: 1.17 mmol/L (ref 1.15–1.40)
Chloride: 102 mmol/L (ref 101–111)
Creatinine, Ser: 1 mg/dL (ref 0.61–1.24)
GLUCOSE: 148 mg/dL — AB (ref 65–99)
HCT: 38 % — ABNORMAL LOW (ref 39.0–52.0)
HEMOGLOBIN: 12.9 g/dL — AB (ref 13.0–17.0)
Potassium: 4.6 mmol/L (ref 3.5–5.1)
Sodium: 137 mmol/L (ref 135–145)
TCO2: 22 mmol/L (ref 0–100)

## 2017-01-12 LAB — MAGNESIUM
MAGNESIUM: 2.4 mg/dL (ref 1.7–2.4)
Magnesium: 2.3 mg/dL (ref 1.7–2.4)

## 2017-01-12 LAB — CREATININE, SERUM: Creatinine, Ser: 1.07 mg/dL (ref 0.61–1.24)

## 2017-01-12 MED ORDER — ENOXAPARIN SODIUM 40 MG/0.4ML ~~LOC~~ SOLN
40.0000 mg | Freq: Every day | SUBCUTANEOUS | Status: DC
  Administered 2017-01-12: 40 mg via SUBCUTANEOUS
  Filled 2017-01-12: qty 0.4

## 2017-01-12 MED ORDER — INSULIN DETEMIR 100 UNIT/ML ~~LOC~~ SOLN
10.0000 [IU] | Freq: Every day | SUBCUTANEOUS | Status: DC
  Filled 2017-01-12: qty 0.1

## 2017-01-12 MED ORDER — METOCLOPRAMIDE HCL 5 MG/ML IJ SOLN
5.0000 mg | Freq: Three times a day (TID) | INTRAMUSCULAR | Status: AC
  Administered 2017-01-12 – 2017-01-13 (×3): 5 mg via INTRAVENOUS
  Filled 2017-01-12 (×3): qty 2

## 2017-01-12 MED ORDER — INSULIN ASPART 100 UNIT/ML ~~LOC~~ SOLN
0.0000 [IU] | SUBCUTANEOUS | Status: DC
  Administered 2017-01-12: 2 [IU] via SUBCUTANEOUS

## 2017-01-12 MED ORDER — INSULIN DETEMIR 100 UNIT/ML ~~LOC~~ SOLN
10.0000 [IU] | Freq: Once | SUBCUTANEOUS | Status: AC
  Administered 2017-01-12: 10 [IU] via SUBCUTANEOUS
  Filled 2017-01-12: qty 0.1

## 2017-01-12 NOTE — Progress Notes (Signed)
Patient ID: Wynetta FinesVictor Macomber Baswell, male   DOB: January 18, 1951, 66 y.o.   MRN: [id number] T[facility] DAILY ICU PROGRESS NOTE                   [address]            [place] [id number]          [phone]   1 Day Post-Op Procedure(s) (LRB): CORONARY ARTERY BYPASS GRAFTING (CABG) x 4 (LIMA to LAD, SVG to DIAGONAL, SVG to OM, SVG to RCA) with ENDOVEIN HARVEST OF RIGHT GREATER SAPHENOUS VEIN (N/A) TRANSESOPHAGEAL ECHOCARDIOGRAM (TEE) (N/A) ENDOVEIN HARVEST OF GREATER SAPHENOUS VEIN (Right) FEMORAL ARTERIAL LINE INSERTION ;16 GAUGE (Right)  Total Length of Stay:  LOS: 3 days   Subjective: Alert and neuro intact   Objective: Vital signs in last 24 hours: Temp:  [97.9 F (36.6 C)-98.8 F (37.1 C)] 98.6 F (37 C) (02/24 1209) Pulse Rate:  [72-94] 73 (02/24 1100) Cardiac Rhythm: (P) Normal sinus rhythm (02/24 1200) Resp:  [11-30] 15 (02/24 1100) BP: (92-129)/(55-89) 116/66 (02/24 1100) SpO2:  [92 %-99 %] 97 % (02/24 1100) Arterial Line BP: (97-128)/(53-75) 109/67 (02/23 1845) FiO2 (%):  [40 %-50 %] 40 % (02/23 1715) Weight:  [223 lb 1.7 oz (101.2 kg)] 223 lb 1.7 oz (101.2 kg) (02/24 0500)  Filed Weights   01/10/17 0445 01/11/17 0500 01/12/17 0500  Weight: 215 lb 8 oz (97.8 kg) 213 lb 3.2 oz (96.7 kg) 223 lb 1.7 oz (101.2 kg)    Weight change: 9 lb 14.5 oz (4.493 kg)   Hemodynamic parameters for last 24 hours: PAP: (19-33)/(11-22) 32/18 CO:  [3.4 L/min-6.3 L/min] 5.4 L/min CI:  [1.5 L/min/m2-2.8 L/min/m2] 2.4 L/min/m2  Intake/Output from previous day: 02/23 0701 - 02/24 0700 In: 5231.3 [I.V.:3301.3; Blood:800; NG/GT:30; IV Piggyback:1100] Out: 4000 [Urine:2340; Emesis/NG output:80; Blood:1200; Chest Tube:380]  Intake/Output this shift: Total I/O In: 228.6 [I.V.:128.6; IV Piggyback:100] Out: -   Current Meds: Scheduled Meds: . acetaminophen  1,000 mg Oral Q6H   Or  . acetaminophen (TYLENOL) oral liquid 160 mg/5 mL  1,000 mg Per Tube Q6H  . aspirin EC  325  mg Oral Daily   Or  . aspirin  324 mg Per Tube Daily  . atorvastatin  80 mg Oral q1800  . bisacodyl  10 mg Oral Daily   Or  . bisacodyl  10 mg Rectal Daily  . cefUROXime (ZINACEF)  IV  1.5 g Intravenous Q12H  . docusate sodium  200 mg Oral Daily  . enoxaparin (LOVENOX) injection  40 mg Subcutaneous QHS  . famotidine (PEPCID) IV  20 mg Intravenous Q12H  . insulin aspart  0-24 Units Subcutaneous Q4H  . [START ON 01/13/2017] insulin detemir  10 Units Subcutaneous Daily  . insulin regular  0-10 Units Intravenous TID WC  . loratadine  10 mg Oral Daily  . metoprolol tartrate  12.5 mg Oral BID   Or  . metoprolol tartrate  12.5 mg Per Tube BID  . multivitamin with minerals  1 tablet Oral Daily  . [START ON 01/13/2017] pantoprazole  40 mg Oral Daily  . potassium chloride (KCL MULTIRUN) 30 mEq in 265 mL IVPB  30 mEq Intravenous Once  . sodium chloride flush  3 mL Intravenous Q12H   Continuous Infusions: . sodium chloride Stopped (01/12/17 1100)  . sodium chloride    . sodium chloride 20 mL/hr at 01/11/17 1351  . dexmedetomidine Stopped (01/11/17 1500)  . insulin (NOVOLIN-R) infusion 2.4 Units/hr (  01/12/17 1300)  . lactated ringers 10 mL/hr at 01/12/17 0019  . lactated ringers 10 mL/hr at 01/12/17 0019  . nitroGLYCERIN Stopped (01/11/17 1345)  . phenylephrine (NEO-SYNEPHRINE) Adult infusion Stopped (01/11/17 2345)   PRN Meds:.sodium chloride, albumin human, lactated ringers, metoprolol, midazolam, morphine injection, ondansetron (ZOFRAN) IV, oxyCODONE, sodium chloride flush, traMADol  General appearance: alert and cooperative Neurologic: intact Heart: regular rate and rhythm, S1, S2 normal, no murmur, click, rub or gallop Lungs: diminished breath sounds bibasilar Abdomen: soft, non-tender; bowel sounds normal; no masses,  no organomegaly Extremities: extremities normal, atraumatic, no cyanosis or edema and Homans sign is negative, no sign of DVT Wound: sternum stable  Lab  Results: CBC: Recent Labs  01/11/17 1845 01/12/17 0358  WBC 17.1* 14.7*  HGB 12.6* 12.0*  HCT 37.7* 35.4*  PLT 166 141*   BMET:  Recent Labs  01/11/17 0426  01/11/17 1835 01/11/17 1845 01/12/17 0358  NA 136  < > 140  --  137  K 3.7  < > 4.3  --  4.4  CL 102  < > 105  --  105  CO2 26  --   --   --  22  GLUCOSE 107*  < > 116*  --  109*  BUN 13  < > 14  --  15  CREATININE 0.95  < > 0.80 0.94 0.71  CALCIUM 9.4  --   --   --  8.1*  < > = values in this interval not displayed.  CMET: Lab Results  Component Value Date   WBC 14.7 (H) 01/12/2017   HGB 12.0 (L) 01/12/2017   HCT 35.4 (L) 01/12/2017   PLT 141 (L) 01/12/2017   GLUCOSE 109 (H) 01/12/2017   ALT 25 01/10/2017   AST 27 01/10/2017   NA 137 01/12/2017   K 4.4 01/12/2017   CL 105 01/12/2017   CREATININE 0.71 01/12/2017   BUN 15 01/12/2017   CO2 22 01/12/2017   INR 1.39 01/11/2017   HGBA1C 5.3 01/10/2017      PT/INR:  Recent Labs  01/11/17 1333  LABPROT 17.2*  INR 1.39   Radiology: Dg Chest Port 1 View  Result Date: 01/12/2017 CLINICAL DATA:  Pneumothorax. EXAM: PORTABLE CHEST 1 VIEW COMPARISON:  One-view chest x-ray 01/11/2017 FINDINGS: The patient has been extubated. The NG tube was removed. The heart remains enlarged. Lung volumes are low. Bibasilar atelectasis is present. A mediastinal drain and left-sided chest tube are in place. There is no significant pneumothorax. The tip of the Swan-Ganz catheter is in the main pulmonary outflow tract. External pacing wires remain. IMPRESSION: 1. Interval extubation and removal of NG tube. 2. Persistent low lung volumes. 3. Support apparatus is otherwise stable. 4. No significant pneumothorax or pneumomediastinum. Electronically Signed   By: Marin Roberts M.D.   On: 01/12/2017 07:46   Dg Chest Port 1 View  Result Date: 01/11/2017 CLINICAL DATA:  Status post CABG today. EXAM: PORTABLE CHEST 1 VIEW COMPARISON:  PA and lateral chest 01/05/2017. FINDINGS:  Endotracheal tube is in place with the tip well above the carina at the level of the clavicular heads. NG tube courses into the stomach and below the inferior margin of the film. Right IJ approach Swan-Ganz catheter tip is in the distal pulmonary outflow tract. Mediastinal drain and left chest tube are seen. Mild bibasilar atelectasis is identified. No pneumothorax. Heart size is normal. IMPRESSION: Support apparatus as described. Negative for pneumothorax with a chest tube in place. Subsegmental bibasilar atelectasis.  Electronically Signed   By: Drusilla Kanner M.D.   On: 01/11/2017 14:27     Assessment/Plan: S/P Procedure(s) (LRB): CORONARY ARTERY BYPASS GRAFTING (CABG) x 4 (LIMA to LAD, SVG to DIAGONAL, SVG to OM, SVG to RCA) with ENDOVEIN HARVEST OF RIGHT GREATER SAPHENOUS VEIN (N/A) TRANSESOPHAGEAL ECHOCARDIOGRAM (TEE) (N/A) ENDOVEIN HARVEST OF GREATER SAPHENOUS VEIN (Right) FEMORAL ARTERIAL LINE INSERTION ;16 GAUGE (Right) Mobilize Diuresis Diabetes control d/c pacing wires d/c tubes/lines See progression orders     [name] 01/12/2017 1:31 PM

## 2017-01-12 NOTE — Progress Notes (Signed)
Patient ID: [name], male   DOB: Jun 12, 1951, 66 y.o.   MRN: [id number] EVENING ROUNDS NOTE :     [address]       [place] [id number]             [phone]                 1 Day Post-Op Procedure(s) (LRB): CORONARY ARTERY BYPASS GRAFTING (CABG) x 4 (LIMA to LAD, SVG to DIAGONAL, SVG to OM, SVG to RCA) with ENDOVEIN HARVEST OF RIGHT GREATER SAPHENOUS VEIN (N/A) TRANSESOPHAGEAL ECHOCARDIOGRAM (TEE) (N/A) ENDOVEIN HARVEST OF GREATER SAPHENOUS VEIN (Right) FEMORAL ARTERIAL LINE INSERTION ;16 GAUGE (Right)  Total Length of Stay:  LOS: 3 days  BP 112/80 (BP Location: Left Arm)   Pulse 72   Temp 98.3 F (36.8 C) (Oral)   Resp 17   Ht 6\' 2"  (1.88 m)   Wt 223 lb 1.7 oz (101.2 kg)   SpO2 97%   BMI 28.65 kg/m   .Intake/Output      02/24 0701 - 02/25 0700   P.O. 300   I.V. (mL/kg) 296.2 (2.9)   Blood    NG/GT    IV Piggyback 100   Total Intake(mL/kg) 696.2 (6.9)   Urine (mL/kg/hr) 350 (0.3)   Emesis/NG output    Blood    Chest Tube 200 (0.2)   Total Output 550   Net +146.2         . sodium chloride Stopped (01/12/17 1100)  . sodium chloride    . sodium chloride 20 mL/hr at 01/11/17 1351  . dexmedetomidine Stopped (01/11/17 1500)  . insulin (NOVOLIN-R) infusion Stopped (01/12/17 1400)  . lactated ringers 10 mL/hr at 01/12/17 0019  . lactated ringers 10 mL/hr at 01/12/17 0019  . nitroGLYCERIN Stopped (01/11/17 1345)  . phenylephrine (NEO-SYNEPHRINE) Adult infusion Stopped (01/11/17 2345)     Lab Results  Component Value Date   WBC 19.3 (H) 01/12/2017   HGB 12.8 (L) 01/12/2017   HCT 38.8 (L) 01/12/2017   PLT 180 01/12/2017   GLUCOSE 148 (H) 01/12/2017   ALT 25 01/10/2017   AST 27 01/10/2017   NA 137 01/12/2017   K 4.6 01/12/2017   CL 102 01/12/2017   CREATININE 1.07 01/12/2017   BUN 19 01/12/2017   CO2 22 01/12/2017   INR 1.39 01/11/2017   HGBA1C 5.3 01/10/2017   Nausea improved  Stable day  Delight OvensEdward B Laqueta Bonaventura MD  Beeper  [phone] Office [phone] 01/12/2017 7:24 PM

## 2017-01-13 ENCOUNTER — Inpatient Hospital Stay (HOSPITAL_COMMUNITY): Payer: Medicare Other

## 2017-01-13 LAB — BASIC METABOLIC PANEL
Anion gap: 9 (ref 5–15)
BUN: 15 mg/dL (ref 6–20)
CO2: 25 mmol/L (ref 22–32)
Calcium: 8.3 mg/dL — ABNORMAL LOW (ref 8.9–10.3)
Chloride: 101 mmol/L (ref 101–111)
Creatinine, Ser: 0.93 mg/dL (ref 0.61–1.24)
GFR calc Af Amer: >60 mL/min (ref >60)
GFR calc non Af Amer: >60 mL/min (ref >60)
Glucose, Bld: 115 mg/dL — ABNORMAL HIGH (ref 65–99)
Potassium: 4.6 mmol/L (ref 3.5–5.1)
Sodium: 135 mmol/L (ref 135–145)

## 2017-01-13 LAB — GLUCOSE, CAPILLARY
GLUCOSE-CAPILLARY: 117 mg/dL — AB (ref 65–99)
GLUCOSE-CAPILLARY: 99 mg/dL (ref 65–99)
Glucose-Capillary: 107 mg/dL — ABNORMAL HIGH (ref 65–99)
Glucose-Capillary: 114 mg/dL — ABNORMAL HIGH (ref 65–99)
Glucose-Capillary: 106 mg/dL — ABNORMAL HIGH (ref 65–99)

## 2017-01-13 LAB — CBC
HCT: 34.7 % — ABNORMAL LOW (ref 39.0–52.0)
Hemoglobin: 11.3 g/dL — ABNORMAL LOW (ref 13.0–17.0)
MCH: 30.9 pg (ref 26.0–34.0)
MCHC: 32.6 g/dL (ref 30.0–36.0)
MCV: 94.8 fL (ref 78.0–100.0)
Platelets: 146 10*3/uL — ABNORMAL LOW (ref 150–400)
RBC: 3.66 MIL/uL — ABNORMAL LOW (ref 4.22–5.81)
RDW: 13.8 % (ref 11.5–15.5)
WBC: 15.4 10*3/uL — ABNORMAL HIGH (ref 4.0–10.5)

## 2017-01-13 MED ORDER — INSULIN ASPART 100 UNIT/ML ~~LOC~~ SOLN: 0.0000 [IU] | Freq: Three times a day (TID) | SUBCUTANEOUS | Status: DC

## 2017-01-13 MED ORDER — MOVING RIGHT ALONG BOOK
Freq: Once | Status: AC
  Administered 2017-01-13: 09:00:00
  Filled 2017-01-13: qty 1

## 2017-01-13 MED ORDER — OXYCODONE HCL 5 MG PO TABS
5.0000 mg | ORAL_TABLET | ORAL | Status: DC | PRN
  Administered 2017-01-14: 5 mg via ORAL
  Filled 2017-01-13 (×2): qty 2
  Filled 2017-01-13: qty 1

## 2017-01-13 MED ORDER — SODIUM CHLORIDE 0.9% FLUSH
3.0000 mL | Freq: Two times a day (BID) | INTRAVENOUS | Status: DC
  Administered 2017-01-13 – 2017-01-15 (×6): 3 mL via INTRAVENOUS

## 2017-01-13 MED ORDER — ACETAMINOPHEN 325 MG PO TABS
650.0000 mg | ORAL_TABLET | Freq: Four times a day (QID) | ORAL | Status: DC | PRN
  Administered 2017-01-13 – 2017-01-16 (×8): 650 mg via ORAL
  Filled 2017-01-13 (×8): qty 2

## 2017-01-13 MED ORDER — ASPIRIN EC 81 MG PO TBEC
81.0000 mg | DELAYED_RELEASE_TABLET | Freq: Every day | ORAL | Status: DC
  Administered 2017-01-13 – 2017-01-16 (×4): 81 mg via ORAL
  Filled 2017-01-13 (×4): qty 1

## 2017-01-13 MED ORDER — TRAMADOL HCL 50 MG PO TABS
50.0000 mg | ORAL_TABLET | ORAL | Status: DC | PRN
  Administered 2017-01-14: 100 mg via ORAL
  Filled 2017-01-13: qty 2

## 2017-01-13 MED ORDER — METOPROLOL TARTRATE 12.5 MG HALF TABLET
12.5000 mg | ORAL_TABLET | Freq: Two times a day (BID) | ORAL | Status: DC
  Administered 2017-01-13 – 2017-01-16 (×7): 12.5 mg via ORAL
  Filled 2017-01-13 (×7): qty 1

## 2017-01-13 MED ORDER — DOCUSATE SODIUM 100 MG PO CAPS
200.0000 mg | ORAL_CAPSULE | Freq: Every day | ORAL | Status: DC
  Administered 2017-01-13 – 2017-01-16 (×4): 200 mg via ORAL
  Filled 2017-01-13 (×4): qty 2

## 2017-01-13 MED ORDER — ONDANSETRON HCL 4 MG/2ML IJ SOLN: 4.0000 mg | Freq: Four times a day (QID) | INTRAMUSCULAR | Status: DC | PRN

## 2017-01-13 MED ORDER — BISACODYL 5 MG PO TBEC
10.0000 mg | DELAYED_RELEASE_TABLET | Freq: Every day | ORAL | Status: DC | PRN
  Administered 2017-01-15: 10 mg via ORAL
  Filled 2017-01-13: qty 2

## 2017-01-13 MED ORDER — BISACODYL 10 MG RE SUPP: 10.0000 mg | Freq: Every day | RECTAL | Status: DC | PRN

## 2017-01-13 MED ORDER — SODIUM CHLORIDE 0.9 % IV SOLN: 250.0000 mL | INTRAVENOUS | Status: DC | PRN

## 2017-01-13 MED ORDER — SODIUM CHLORIDE 0.9% FLUSH: 3.0000 mL | INTRAVENOUS | Status: DC | PRN

## 2017-01-13 MED ORDER — PANTOPRAZOLE SODIUM 40 MG PO TBEC
40.0000 mg | DELAYED_RELEASE_TABLET | Freq: Every day | ORAL | Status: DC
  Administered 2017-01-13 – 2017-01-16 (×4): 40 mg via ORAL
  Filled 2017-01-13 (×4): qty 1

## 2017-01-13 MED ORDER — ONDANSETRON HCL 4 MG PO TABS: 4.0000 mg | ORAL_TABLET | Freq: Four times a day (QID) | ORAL | Status: DC | PRN

## 2017-01-13 NOTE — Progress Notes (Signed)
Patient ID: [name], male   DOB: [name], 66 y.o.   MRN: [id number] TCTS DAILY ICU PROGRESS NOTE                   [address]            [place] [id number]          [phone]   2 Days Post-Op Procedure(s) (LRB): CORONARY ARTERY BYPASS GRAFTING (CABG) x 4 (LIMA to LAD, SVG to DIAGONAL, SVG to OM, SVG to RCA) with ENDOVEIN HARVEST OF RIGHT GREATER SAPHENOUS VEIN (N/A) TRANSESOPHAGEAL ECHOCARDIOGRAM (TEE) (N/A) ENDOVEIN HARVEST OF GREATER SAPHENOUS VEIN (Right) FEMORAL ARTERIAL LINE INSERTION ;16 GAUGE (Right)  Total Length of Stay:  LOS: 4 days   Subjective: Feels better today, no gi complaints today, walked this am  Objective: Vital signs in last 24 hours: Temp:  [97.6 F (36.4 C)-99.3 F (37.4 C)] 98.7 F (37.1 C) (02/25 0809) Pulse Rate:  [68-82] 82 (02/25 0700) Cardiac Rhythm: Normal sinus rhythm (02/25 0400) Resp:  [11-25] 15 (02/25 0700) BP: (102-122)/(53-86) 108/53 (02/25 0700) SpO2:  [92 %-99 %] 92 % (02/25 0700)  Filed Weights   01/10/17 0445 01/11/17 0500 01/12/17 0500  Weight: 215 lb 8 oz (97.8 kg) 213 lb 3.2 oz (96.7 kg) 223 lb 1.7 oz (101.2 kg)    Weight change:    Hemodynamic parameters for last 24 hours: PAP: (29-32)/(14-18) 32/18  Intake/Output from previous day: 02/24 0701 - 02/25 0700 In: 946.2 [P.O.:300; I.V.:496.2; IV Piggyback:150] Out: 920 [Urine:690; Chest Tube:230]  Intake/Output this shift: No intake/output data recorded.  Current Meds: Scheduled Meds: . acetaminophen  1,000 mg Oral Q6H   Or  . acetaminophen (TYLENOL) oral liquid 160 mg/5 mL  1,000 mg Per Tube Q6H  . aspirin EC  325 mg Oral Daily   Or  . aspirin  324 mg Per Tube Daily  . atorvastatin  80 mg Oral q1800  . bisacodyl  10 mg Oral Daily   Or  . bisacodyl  10 mg Rectal Daily  . cefUROXime (ZINACEF)  IV  1.5 g Intravenous Q12H  . docusate sodium  200 mg Oral Daily  . enoxaparin (LOVENOX) injection  40 mg Subcutaneous QHS  . insulin  aspart  0-24 Units Subcutaneous Q4H  . insulin detemir  10 Units Subcutaneous Daily  . insulin regular  0-10 Units Intravenous TID WC  . loratadine  10 mg Oral Daily  . metoprolol tartrate  12.5 mg Oral BID   Or  . metoprolol tartrate  12.5 mg Per Tube BID  . multivitamin with minerals  1 tablet Oral Daily  . pantoprazole  40 mg Oral Daily  . potassium chloride (KCL MULTIRUN) 30 mEq in 265 mL IVPB  30 mEq Intravenous Once  . sodium chloride flush  3 mL Intravenous Q12H   Continuous Infusions: . sodium chloride Stopped (01/12/17 1100)  . sodium chloride    . sodium chloride 20 mL/hr at 01/11/17 1351  . dexmedetomidine Stopped (01/11/17 1500)  . insulin (NOVOLIN-R) infusion Stopped (01/12/17 1400)  . lactated ringers Stopped (01/13/17 0400)  . lactated ringers 10 mL/hr at 01/12/17 0019  . nitroGLYCERIN Stopped (01/11/17 1345)  . phenylephrine (NEO-SYNEPHRINE) Adult infusion Stopped (01/11/17 2345)   PRN Meds:.sodium chloride, lactated ringers, metoprolol, midazolam, morphine injection, ondansetron (ZOFRAN) IV, oxyCODONE, sodium chloride flush, traMADol  General appearance: alert and cooperative Neurologic: intact Heart: regular rate and rhythm, S1, S2 normal, no murmur, click, rub or gallop Lungs:  clear to auscultation bilaterally Abdomen: soft, non-tender; bowel sounds normal; no masses,  no organomegaly Extremities: extremities normal, atraumatic, no cyanosis or edema and Homans sign is negative, no sign of DVT Wound: sternum stable   Lab Results: CBC: Recent Labs  01/12/17 1732 01/13/17 0430  WBC 19.3* 15.4*  HGB 12.8* 11.3*  HCT 38.8* 34.7*  PLT 180 146*   BMET:  Recent Labs  01/12/17 0358 01/12/17 1726 01/12/17 1732 01/13/17 0430  NA 137 137  --  135  K 4.4 4.6  --  4.6  CL 105 102  --  101  CO2 22  --   --  25  GLUCOSE 109* 148*  --  115*  BUN 15 19  --  15  CREATININE 0.71 1.00 1.07 0.93  CALCIUM 8.1*  --   --  8.3*    CMET: Lab Results  Component  Value Date   WBC 15.4 (H) 01/13/2017   HGB 11.3 (L) 01/13/2017   HCT 34.7 (L) 01/13/2017   PLT 146 (L) 01/13/2017   GLUCOSE 115 (H) 01/13/2017   ALT 25 01/10/2017   AST 27 01/10/2017   NA 135 01/13/2017   K 4.6 01/13/2017   CL 101 01/13/2017   CREATININE 0.93 01/13/2017   BUN 15 01/13/2017   CO2 25 01/13/2017   INR 1.39 01/11/2017   HGBA1C 5.3 01/10/2017      PT/INR:  Recent Labs  01/11/17 1333  LABPROT 17.2*  INR 1.39   Radiology: No results found.   Assessment/Plan: S/P Procedure(s) (LRB): CORONARY ARTERY BYPASS GRAFTING (CABG) x 4 (LIMA to LAD, SVG to DIAGONAL, SVG to OM, SVG to RCA) with ENDOVEIN HARVEST OF RIGHT GREATER SAPHENOUS VEIN (N/A) TRANSESOPHAGEAL ECHOCARDIOGRAM (TEE) (N/A) ENDOVEIN HARVEST OF GREATER SAPHENOUS VEIN (Right) FEMORAL ARTERIAL LINE INSERTION ;16 GAUGE (Right) Mobilize Diuresis d/c tubes/lines Plan for transfer to step-down: see transfer orders Expected Acute  Blood - loss Anemia    [name] 01/13/2017 8:31 AM

## 2017-01-13 NOTE — Progress Notes (Signed)
Pt received from 2S RN. Pt and wife oriented to room and equipment. VSS. Telemetry applied, CCMD notified. Call bell wtihin reach, will continue to monitor.   Leonidas Rombergaitlin S Bumbledare, RN

## 2017-01-14 ENCOUNTER — Encounter (HOSPITAL_COMMUNITY): Payer: Self-pay | Admitting: Cardiothoracic Surgery

## 2017-01-14 LAB — BASIC METABOLIC PANEL
Anion gap: 6 (ref 5–15)
BUN: 12 mg/dL (ref 6–20)
CO2: 25 mmol/L (ref 22–32)
Calcium: 8.4 mg/dL — ABNORMAL LOW (ref 8.9–10.3)
Chloride: 105 mmol/L (ref 101–111)
Creatinine, Ser: 0.9 mg/dL (ref 0.61–1.24)
GFR calc Af Amer: >60 mL/min (ref >60)
GFR calc non Af Amer: >60 mL/min (ref >60)
Glucose, Bld: 114 mg/dL — ABNORMAL HIGH (ref 65–99)
Potassium: 4 mmol/L (ref 3.5–5.1)
Sodium: 136 mmol/L (ref 135–145)

## 2017-01-14 LAB — CBC
HCT: 32.4 % — ABNORMAL LOW (ref 39.0–52.0)
Hemoglobin: 10.7 g/dL — ABNORMAL LOW (ref 13.0–17.0)
MCH: 31.1 pg (ref 26.0–34.0)
MCHC: 33 g/dL (ref 30.0–36.0)
MCV: 94.2 fL (ref 78.0–100.0)
Platelets: 145 10*3/uL — ABNORMAL LOW (ref 150–400)
RBC: 3.44 MIL/uL — ABNORMAL LOW (ref 4.22–5.81)
RDW: 13.8 % (ref 11.5–15.5)
WBC: 11 10*3/uL — ABNORMAL HIGH (ref 4.0–10.5)

## 2017-01-14 LAB — GLUCOSE, CAPILLARY
GLUCOSE-CAPILLARY: 121 mg/dL — AB (ref 65–99)
GLUCOSE-CAPILLARY: 102 mg/dL — AB (ref 65–99)
GLUCOSE-CAPILLARY: 116 mg/dL — AB (ref 65–99)
Glucose-Capillary: 106 mg/dL — ABNORMAL HIGH (ref 65–99)

## 2017-01-14 MED FILL — Sodium Chloride IV Soln 0.9%: INTRAVENOUS | Qty: 2000 | Status: AC

## 2017-01-14 MED FILL — Lidocaine HCl IV Inj 20 MG/ML: INTRAVENOUS | Qty: 5 | Status: AC

## 2017-01-14 MED FILL — Sodium Bicarbonate IV Soln 8.4%: INTRAVENOUS | Qty: 50 | Status: AC

## 2017-01-14 MED FILL — Mannitol IV Soln 20%: INTRAVENOUS | Qty: 500 | Status: AC

## 2017-01-14 MED FILL — Heparin Sodium (Porcine) Inj 1000 Unit/ML: INTRAMUSCULAR | Qty: 20 | Status: AC

## 2017-01-14 MED FILL — Electrolyte-R (PH 7.4) Solution: INTRAVENOUS | Qty: 4000 | Status: AC

## 2017-01-14 NOTE — Progress Notes (Addendum)
[address]       [place] [id number]             [phone]      3 Days Post-Op Procedure(s) (LRB): CORONARY ARTERY BYPASS GRAFTING (CABG) x 4 (LIMA to LAD, SVG to DIAGONAL, SVG to OM, SVG to RCA) with ENDOVEIN HARVEST OF RIGHT GREATER SAPHENOUS VEIN (N/A) TRANSESOPHAGEAL ECHOCARDIOGRAM (TEE) (N/A) ENDOVEIN HARVEST OF GREATER SAPHENOUS VEIN (Right) FEMORAL ARTERIAL LINE INSERTION ;16 GAUGE (Right) Subjective: Feels ok, some ringing in left ear. Has chroinic tinitis but this may be an exacerbation  Objective: Vital signs in last 24 hours: Temp:  [98 F (36.7 C)-99.2 F (37.3 C)] 99.2 F (37.3 C) (02/26 0558) Pulse Rate:  [78-93] 92 (02/26 0558) Cardiac Rhythm: Normal sinus rhythm (02/26 0717) Resp:  [13-20] 18 (02/26 0558) BP: (94-129)/(57-78) 129/72 (02/26 0558) SpO2:  [91 %-95 %] 93 % (02/26 0558) Weight:  [220 lb 4.8 oz (99.9 kg)] 220 lb 4.8 oz (99.9 kg) (02/26 0558)  Hemodynamic parameters for last 24 hours:    Intake/Output from previous day: 02/25 0701 - 02/26 0700 In: 1010 [P.O.:1000; I.V.:10] Out: 1650 [Urine:1650] Intake/Output this shift: No intake/output data recorded.  General appearance: alert, cooperative and no distress Heart: regular rate and rhythm Lungs: mildly dim in bases Abdomen: benign Extremities: no sig edema Wound: incis healing well  Lab Results:  Recent Labs  01/13/17 0430 01/14/17 0213  WBC 15.4* 11.0*  HGB 11.3* 10.7*  HCT 34.7* 32.4*  PLT 146* 145*   BMET:  Recent Labs  01/13/17 0430 01/14/17 0213  NA 135 136  K 4.6 4.0  CL 101 105  CO2 25 25  GLUCOSE 115* 114*  BUN 15 12  CREATININE 0.93 0.90  CALCIUM 8.3* 8.4*    PT/INR:  Recent Labs  01/11/17 1333  LABPROT 17.2*  INR 1.39   ABG    Component Value Date/Time   PHART 7.329 (L) 01/11/2017 1830   HCO3 22.6 01/11/2017 1830   TCO2 22 01/12/2017 1726   ACIDBASEDEF 3.0 (H) 01/11/2017 1830   O2SAT 97.0 01/11/2017 1830   CBG (last 3)     Recent Labs  01/13/17 1621 01/13/17 2101 01/14/17 0602  GLUCAP 106* 99 106*    Meds Scheduled Meds: . aspirin EC  81 mg Oral Daily  . atorvastatin  80 mg Oral q1800  . docusate sodium  200 mg Oral Daily  . insulin aspart  0-24 Units Subcutaneous TID AC & HS  . metoprolol tartrate  12.5 mg Oral BID  . pantoprazole  40 mg Oral QAC breakfast  . sodium chloride flush  3 mL Intravenous Q12H   Continuous Infusions: PRN Meds:.sodium chloride, acetaminophen, bisacodyl **OR** bisacodyl, ondansetron **OR** ondansetron (ZOFRAN) IV, oxyCODONE, sodium chloride flush, traMADol  Xrays Dg Chest Port 1 View  Result Date: 01/13/2017 CLINICAL DATA:  CABG 2 days ago. EXAM: PORTABLE CHEST 1 VIEW COMPARISON:  01/12/2017. FINDINGS: The heart size is normal. Swan-Ganz catheter has been removed. A right IJ sheath remains. Mediastinal drain is in place. Lung volumes are slightly improved. A left-sided chest tube remains in place. A tiny apical pneumothorax is present. Bibasilar atelectasis remains, left greater than right. There is mild pulmonary vascular congestion. IMPRESSION: 1. Interval removal of Swan-Ganz catheter. 2. Left-sided chest tube remains in place with a tiny apical pneumothorax. 3. Mild bibasilar atelectasis with improving lung volumes. These results will be called to the ordering clinician or representative by the Radiologist Assistant, and  communication documented in the PACS or zVision Dashboard. Electronically Signed   By: Marin Roberts M.D.   On: 01/13/2017 09:36    Assessment/Plan: S/P Procedure(s) (LRB): CORONARY ARTERY BYPASS GRAFTING (CABG) x 4 (LIMA to LAD, SVG to DIAGONAL, SVG to OM, SVG to RCA) with ENDOVEIN HARVEST OF RIGHT GREATER SAPHENOUS VEIN (N/A) TRANSESOPHAGEAL ECHOCARDIOGRAM (TEE) (N/A) ENDOVEIN HARVEST OF GREATER SAPHENOUS VEIN (Right) FEMORAL ARTERIAL LINE INSERTION ;16 GAUGE (Right) 1 doing well 2 Wean O2 off 3 hemodyn stable in sinus, BP a little low for  ace-inhib 4 anemia is pretty stable, leukocytosis improved 5 cont rehab and pulm toilet  LOS: 5 days    [name] 01/14/2017  Stable, poss home 1-2 days I have seen and examined [employer] and agree with the above assessment  and plan.  [name] MD [facility] [phone] Office [phone] 01/14/2017 12:47 PM

## 2017-01-14 NOTE — Discharge Instructions (Signed)
Endoscopic Saphenous Vein Harvesting, Care After °Introduction °Refer to this sheet in the next few weeks. These instructions provide you with information about caring for yourself after your procedure. Your health care provider may also give you more specific instructions. Your treatment has been planned according to current medical practices, but problems sometimes occur. Call your health care provider if you have any problems or questions after your procedure. °What can I expect after the procedure? °After the procedure, it is common to have: °· Pain. °· Bruising. °· Swelling. °· Numbness. °Follow these instructions at home: °Medicine °· Take over-the-counter and prescription medicines only as told by your health care provider. °· Do not drive or operate heavy machinery while taking prescription pain medicine. °Incision care °· Follow instructions from your health care provider about how to take care of the cut made during surgery (incision). Make sure you: °¨ Wash your hands with soap and water before you change your bandage (dressing). If soap and water are not available, use hand sanitizer. °¨ Change your dressing as told by your health care provider. °¨ Leave stitches (sutures), skin glue, or adhesive strips in place. These skin closures may need to be in place for 2 weeks or longer. If adhesive strip edges start to loosen and curl up, you may trim the loose edges. Do not remove adhesive strips completely unless your health care provider tells you to do that. °· Check your incision area every day for signs of infection. Check for: °¨ More redness, swelling, or pain. °¨ More fluid or blood. °¨ Warmth. °¨ Pus or a bad smell. °General instructions °· Raise (elevate) your legs above the level of your heart while you are sitting or lying down. °· Do any exercises your health care providers have given you. These may include deep breathing, coughing, and walking exercises. °· Do not shower, take baths, swim, or use  a hot tub unless told by your health care provider. °· Wear your elastic stocking if told by your health care provider. °· Keep all follow-up visits as told by your health care provider. This is important. °Contact a health care provider if: °· Medicine does not help your pain. °· Your pain gets worse. °· You have new leg bruises or your leg bruises get bigger. °· You have a fever. °· Your leg feels numb. °· You have more redness, swelling, or pain around your incision. °· You have more fluid or blood coming from your incision. °· Your incision feels warm to the touch. °· You have pus or a bad smell coming from your incision. °Get help right away if: °· Your pain is severe. °· You develop pain, tenderness, warmth, redness, or swelling in any part of your leg. °· You have chest pain. °· You have trouble breathing. °This information is not intended to replace advice given to you by your health care provider. Make sure you discuss any questions you have with your health care provider. °Document Released: 07/18/2011 Document Revised: 04/12/2016 Document Reviewed: 09/19/2015 °© 2017 Elsevier °Coronary Artery Bypass Grafting, Care After °Refer to this sheet in the next few weeks. These instructions provide you with information on caring for yourself after your procedure. Your health care provider may also give you more specific instructions. Your treatment has been planned according to current medical practices, but problems sometimes occur. Call your health care provider if you have any problems or questions after your procedure. °WHAT TO EXPECT AFTER THE PROCEDURE °Recovery from surgery will be different for   everyone. Some people feel well after 3 or 4 weeks, while for others it takes longer. After your procedure, it is typical to have the following: °· Nausea and a lack of appetite.   °· Constipation. °· Weakness and fatigue.   °· Depression or irritability.   °· Pain or discomfort at your incision site. °HOME CARE  INSTRUCTIONS °· Take medicines only as directed by your health care provider. Do not stop taking medicines or start any new medicines without first checking with your health care provider. °· Take your pulse as directed by your health care provider. °· Perform deep breathing as directed by your health care provider. If you were given a device called an incentive spirometer, use it to practice deep breathing several times a day. Support your chest with a pillow or your arms when you take deep breaths or cough. °· Keep incision areas clean, dry, and protected. Remove or change any bandages (dressings) only as directed by your health care provider. You may have skin adhesive strips over the incision areas. Do not take the strips off. They will fall off on their own. °· Check incision areas daily for any swelling, redness, or drainage. °· If incisions were made in your legs, do the following: °¨ Avoid crossing your legs.   °¨ Avoid sitting for long periods of time. Change positions every 30 minutes.   °¨ Elevate your legs when you are sitting. °· Wear compression stockings as directed by your health care provider. These stockings help keep blood clots from forming in your legs. °· Take showers once your health care provider approves. Until then, only take sponge baths. Pat incisions dry. Do not rub incisions with a washcloth or towel. Do not take baths, swim, or use a hot tub until your health care provider approves. °· Eat foods that are high in fiber, such as raw fruits and vegetables, whole grains, beans, and nuts. Meats should be lean cut. Avoid canned, processed, and fried foods. °· Drink enough fluid to keep your urine clear or pale yellow. °· Weigh yourself every day. This helps identify if you are retaining fluid that may make your heart and lungs work harder. °· Rest and limit activity as directed by your health care provider. You may be instructed to: °¨ Stop any activity at once if you have chest pain,  shortness of breath, irregular heartbeats, or dizziness. Get help right away if you have any of these symptoms. °¨ Move around frequently for short periods or take short walks as directed by your health care provider. Increase your activities gradually. You may need physical therapy or cardiac rehabilitation to help strengthen your muscles and build your endurance. °¨ Avoid lifting, pushing, or pulling anything heavier than 10 lb (4.5 kg) for at least 6 weeks after surgery. °· Do not drive until your health care provider approves.  °· Ask your health care provider when you may return to work. °· Ask your health care provider when you may resume sexual activity. °· Keep all follow-up visits as directed by your health care provider. This is important. °SEEK MEDICAL CARE IF: °· You have swelling, redness, increasing pain, or drainage at the site of an incision. °· You have a fever. °· You have swelling in your ankles or legs. °· You have pain in your legs.   °· You gain 2 or more pounds (0.9 kg) a day. °· You are nauseous or vomit. °· You have diarrhea.  °SEEK IMMEDIATE MEDICAL CARE IF: °· You have chest pain that goes to your   jaw or arms. °· You have shortness of breath.   °· You have a fast or irregular heartbeat.   °· You notice a "clicking" in your breastbone (sternum) when you move.   °· You have numbness or weakness in your arms or legs. °· You feel dizzy or light-headed.   °MAKE SURE YOU: °· Understand these instructions. °· Will watch your condition. °· Will get help right away if you are not doing well or get worse. °This information is not intended to replace advice given to you by your health care provider. Make sure you discuss any questions you have with your health care provider. °Document Released: 05/25/2005 Document Revised: 11/26/2014 Document Reviewed: 04/14/2013 °Elsevier Interactive Patient Education © 2017 [employer] ° °

## 2017-01-14 NOTE — Care Management Important Message (Signed)
Important Message  Patient Details  Name: [name] MRN: [id number] Date of Birth: 07-Apr-1951   Medicare Important Message Given:  Yes    [name] Abena 01/14/2017, 1:32 PM

## 2017-01-14 NOTE — Discharge Summary (Signed)
Physician Discharge Summary  Patient ID: [name] MRN: [id number] DOB/AGE: [birth year] 66 y.o.  Admit date: 01/09/2017 Discharge date: 01/16/2017  Admission Diagnoses:Unstable angina  Discharge Diagnoses:  Active Problems:   Unstable angina (HCC)   Coronary artery disease involving coronary bypass graft of native heart with unstable angina pectoris (HCC)   ACS (acute coronary syndrome) (HCC)   Paroxysmal supraventricular tachycardia (HCC)   Psoriasis   Hyperlipidemia   Essential hypertension   History of BPH   Kidney stones   Low testosterone   Vitamin D deficiency   Coronary artery disease  Patient Active Problem List   Diagnosis Date Noted  . Coronary artery disease 01/11/2017  . Unstable angina (HCC) 01/09/2017  . ACS (acute coronary syndrome) (HCC) 01/09/2017  . Paroxysmal supraventricular tachycardia (HCC) 01/09/2017  . Psoriasis 01/09/2017  . Hyperlipidemia 01/09/2017  . Essential hypertension 01/09/2017  . History of BPH 01/09/2017  . Kidney stones 01/09/2017  . Low testosterone 01/09/2017  . Vitamin D deficiency 01/09/2017  . Coronary artery disease involving coronary bypass graft of native heart with unstable angina pectoris (HCC)    History of Present Illness: at time of consultation    The patient is a 66 year old male referred for cardiothoracic surgical consultation. He underwent cardiac catheterization on today's date. He was found to have severe proximal LAD lesion and several other blockages in the 50-55% range. He has a history of hyperlipidemia and hypertension. He was in town for his sister's funeral but [place]. He notes  chest pressure over the last 2 weeks and notes that it has been progressive in nature but does resolve with rest. [facility] [place] [facility] being seen in the urgent care and he was set up for an exercise treadmill test. He developed increasing chest pain as well as diaphoresis with  radiation down his left arm. He took 2 325 mg aspirin tablets and came to the emergency department. EKG showed ST depression in leads V3 and his first troponin was negative. He was d/c from ER Saturday night. He was seen in cardiology office Monday by Dr Chales Abrahams and arranged for outpatient cath today . 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   Discharged Condition: good  Hospital Course: The patient was admitted to the emergency department for further evaluation and treatment to include cardiac catheterization. He was found to have severe LAD as well as multivessel coronary artery disease and cardiothoracic surgical consultation was obtained with Sheliah Plane M.D. Dr. Tyrone Sage evaluated the patient and his studies and agreed with recommendations to proceed with surgical coronary artery revascularization. On 01/11/2017 he was taken the operating room where he underwent the below described procedure. He tolerated well was taken to the surgical intensive care unit in stable condition  Postoperative hospital course Patient has overall done well. He was weaned from the ventilator without difficulty using standard protocols. He is maintained stable hemodynamics in sinus rhythm. He does have some postoperative volume overload which has responded diuretics. He has an expected acute blood loss anemia which is stabilized. All routine lines, monitors and drainage devices have been discontinued in the standard fashion. Incisions are noted be healing well without evidence of infection. He starting diet. Oxygen is being weaned and he maintains good saturations. Marland Kitchen He is tolerating gradually increasing activities using standard protocols. Epicardial pacing wires were removed on 02/27. He has been tolerating a diet and had a bowel movement. Chest  tube sutures were removed on 02/28. He is felt surgically stable today.  Consults: cardiology  Significant Diagnostic Studies: angiography:  cardiac cath  Treatments: surgery:                01/11/2017  1:25 PM  PATIENT:  [name]  66 y.o. male  PRE-OPERATIVE DIAGNOSIS:  CAD  POST-OPERATIVE DIAGNOSIS:  CAD  PROCEDURE: TRANSESOPHAGEAL ECHOCARDIOGRAM (TEE), MEDIAN STERNOTOMY for CORONARY ARTERY BYPASS GRAFTING (CABG) x 4 (LIMA to LAD, SVG to DIAGONAL, SVG to OM, SVG to RCA) with ENDOVEIN HARVEST OF RIGHT GREATER SAPHENOUS VEIN   SURGEON:  Surgeon(s) and Role:    * Delight OvensEdward B Gerhardt, MD - Primary  PHYSICIAN ASSISTANT: Doree Fudgeonielle Deserae Jennings PA-C  ANESTHESIA:   general  Discharge Exam: Blood pressure 133/81, pulse 82, temperature 99.6 F (37.6 C), temperature source Oral, resp. rate 18, height 6\' 2"  (1.88 m), weight 217 lb 14.4 oz (98.8 kg), SpO2 94 %.  Cardiovascular: RRR Pulmonary: Slightly diminished at bases Abdomen: Soft, non tender, bowel sounds present. Extremities: Mild bilateral lower extremity edema. Wounds: Aquacel dressing removed. Wound is clean and dry.  No erythema or signs of infection.  Disposition: 01-Home or Self Care   Allergies as of 01/16/2017      Reactions   Halibut Liver Oil [fish Oil] Other (See Comments)   Halibut fish causes runny nose   Shrimp [shellfish Allergy] Other (See Comments)   Causes runny nose      Medication List    STOP taking these medications   carvedilol 6.25 MG tablet Commonly known as:  COREG   nitroGLYCERIN 0.4 MG SL tablet Commonly known as:  NITROSTAT     TAKE these medications   acetaminophen 325 MG tablet Commonly known as:  TYLENOL Take 2 tablets (650 mg total) by mouth every 6 (six) hours as needed for mild pain.   aspirin EC 81 MG tablet Take 81 mg by mouth daily.   atorvastatin 80 MG tablet Commonly known as:  LIPITOR Take 1 tablet (80 mg total) by mouth daily at 6 PM. What changed:  medication strength  how much to take   cetirizine 10 MG tablet Commonly known as:  ZYRTEC Take 10 mg by mouth daily.   furosemide  40 MG tablet Commonly known as:  LASIX Take 1 tablet (40 mg total) by mouth daily. For 5 days then stop.   lisinopril 2.5 MG tablet Commonly known as:  PRINIVIL,ZESTRIL Take 1 tablet (2.5 mg total) by mouth daily.   metoprolol tartrate 25 MG tablet Commonly known as:  LOPRESSOR Take 0.5 tablets (12.5 mg total) by mouth 2 (two) times daily.   multivitamin with minerals Tabs tablet Take 1 tablet by mouth daily.   oxyCODONE 5 MG immediate release tablet Commonly known as:  Oxy IR/ROXICODONE Please take 5 mg by mouth every 4-6 hours PRN severe pain.   potassium chloride SA 20 MEQ tablet Commonly known as:  K-DUR,KLOR-CON Take 1 tablet (20 mEq total) by mouth daily. For 5 days then stop.   Vitamin D-3 5000 units Tabs Take 1,500 Units by mouth daily.      The patient has been discharged on:   1.Beta Blocker:  Yes [  x ]                              No   [   ]  If No, reason:  2.Ace Inhibitor/ARB: Yes [ x  ]                                     No  [    ]                                     If No, reason:  3.Statin:   Yes [ x  ]                  No  [   ]                  If No, reason:  4.Ecasa:  Yes  [ x  ]                  No   [   ]                  If No, reason:  Follow-up Information    [name], MD Follow up.   Specialty:  Cardiothoracic Surgery Why:  PA/LAT CXR to be taken (at [facility] which is in the same building as Dr. Dennie Maizes office) on 02/14/2017 at 2:30 pm;Appointment time is at 3:00 pm Contact information: [address] [place] [address] [phone]        Cline Crock, PA-C Follow up on 01/29/2017.   Specialties:  Cardiology, Radiology Why:  Appointment time is at 8:00 am Contact information: [address] [place] [address] [phone]           Signed: Ardelle Balls PA-C 01/16/2017, 8:52 AM

## 2017-01-14 NOTE — Progress Notes (Signed)
CARDIAC REHAB PHASE I   PRE:  Rate/Rhythm: 89 SR  BP:  Sitting: 127/76        SaO2: 98 3L  MODE:  Ambulation: 490 ft   POST:  Rate/Rhythm: 110 ST  BP:  Sitting: 141/82         SaO2: 99 3L  Pt concerned about his oxygen dropping last night, discussed the need to wean O2, pt hesitant to walk on less than 3L, agreeable to try to wean to RA at rest today as able. Pt ambulated 490 ft on 3L O2, rolling walker, assist x1, slow, steady gait, tolerated well. Pt c/o DOE, declined rest stop. Pt sats 99% on 3L O2, decreased O2 to 2L upon return to room, pt and RN aware. Encouraged IS, additional ambulation x2 today. Pt to recliner after walk, call bell within reach. Will follow.    [phone] Joylene GrapesEmily C Niels Cranshaw, RN, BSN 01/14/2017 11:36 AM

## 2017-01-15 ENCOUNTER — Inpatient Hospital Stay (HOSPITAL_COMMUNITY): Payer: Medicare Other

## 2017-01-15 LAB — GLUCOSE, CAPILLARY
GLUCOSE-CAPILLARY: 91 mg/dL (ref 65–99)
GLUCOSE-CAPILLARY: 126 mg/dL — AB (ref 65–99)

## 2017-01-15 MED ORDER — POTASSIUM CHLORIDE CRYS ER 20 MEQ PO TBCR
20.0000 meq | EXTENDED_RELEASE_TABLET | Freq: Every day | ORAL | Status: DC
  Administered 2017-01-15 – 2017-01-16 (×2): 20 meq via ORAL
  Filled 2017-01-15 (×2): qty 1

## 2017-01-15 MED ORDER — FUROSEMIDE 40 MG PO TABS
40.0000 mg | ORAL_TABLET | Freq: Every day | ORAL | Status: DC
  Administered 2017-01-15 – 2017-01-16 (×2): 40 mg via ORAL
  Filled 2017-01-15: qty 1

## 2017-01-15 NOTE — Progress Notes (Addendum)
      [address]       [place] [id number]             [phone]        4 Days Post-Op Procedure(s) (LRB): CORONARY ARTERY BYPASS GRAFTING (CABG) x 4 (LIMA to LAD, SVG to DIAGONAL, SVG to OM, SVG to RCA) with ENDOVEIN HARVEST OF RIGHT GREATER SAPHENOUS VEIN (N/A) TRANSESOPHAGEAL ECHOCARDIOGRAM (TEE) (N/A) ENDOVEIN HARVEST OF GREATER SAPHENOUS VEIN (Right) FEMORAL ARTERIAL LINE INSERTION ;16 GAUGE (Right)  Subjective: Patient passing flatus but no bowel movement yet.  Objective: Vital signs in last 24 hours: Temp:  [98.4 F (36.9 C)-99.2 F (37.3 C)] 98.4 F (36.9 C) (02/27 0441) Pulse Rate:  [76-96] 76 (02/27 0441) Cardiac Rhythm: Sinus tachycardia (02/26 1900) Resp:  [18] 18 (02/27 0441) BP: (115-131)/(64-76) 131/68 (02/27 0441) SpO2:  [95 %-97 %] 95 % (02/27 0441) Weight:  [221 lb 1.9 oz (100.3 kg)] 221 lb 1.9 oz (100.3 kg) (02/27 0441)  Pre op weight 96.7 kg Current Weight  01/15/17 221 lb 1.9 oz (100.3 kg)      Intake/Output from previous day: 02/26 0701 - 02/27 0700 In: 360 [P.O.:360] Out: 2100 [Urine:2100]   Physical Exam:  Cardiovascular: RRR Pulmonary: Slightly diminished at bases Abdomen: Soft, non tender, bowel sounds present. Extremities: Mild bilateral lower extremity edema. Wounds: Aquacel dressing removed. Wound is clean and dry.  No erythema or signs of infection.  Lab Results: CBC: Recent Labs  01/13/17 0430 01/14/17 0213  WBC 15.4* 11.0*  HGB 11.3* 10.7*  HCT 34.7* 32.4*  PLT 146* 145*   BMET:  Recent Labs  01/13/17 0430 01/14/17 0213  NA 135 136  K 4.6 4.0  CL 101 105  CO2 25 25  GLUCOSE 115* 114*  BUN 15 12  CREATININE 0.93 0.90  CALCIUM 8.3* 8.4*    PT/INR:  Lab Results  Component Value Date   INR 1.39 01/11/2017   INR 1.11 01/10/2017   INR 1.0 01/08/2017   ABG:  INR: Will add last result for INR, ABG once components are confirmed Will add last 4 CBG results once components are  confirmed  Assessment/Plan:  1. CV - SR in the 70's. On Lopressor 12.5 mg bid. 2.  Pulmonary - On 1 liter of oxygen via [place]. Will wean to room air as able. NO CXR ordered today so will order PA/LAT. Encourage incentive spirometer. 3. Volume Overload - Will start Lasix 40 mg daily 4.  Acute blood loss anemia - H and H yesterday 10.7 and 32.4 5. Mild thrombocytopenia-platelets 145,000 6. CBGs 102/116/91. Stop accu checks and SS PRN. 7. Remove EPW 8. LOC if no bowel movement today 9. Hopefully, home in am  ZIMMERMAN,DONIELLE MPA-C 01/15/2017,7:28 AM  Off o2 feel better Plan home in [place] tomorrow I have seen and examined Constellation BrandsVictor Macomber Cole and agree with the above assessment  and plan.  Delight OvensEdward B Elnor Renovato MD Bee[place] [phone] Office [phone] 01/15/2017 12:58 PM

## 2017-01-15 NOTE — Op Note (Signed)
NAME:  [name], [name] NO.:  MEDICAL RECORD NO.:  [id number]  LOCATION:                                 FACILITY:  PHYSICIAN:  Sheliah Plane, MD         DATE OF BIRTH:  DATE OF PROCEDURE:  01/11/2017 DATE OF DISCHARGE:                              OPERATIVE REPORT   PREOPERATIVE DIAGNOSIS:  Unstable angina with coronary occlusive disease.  POSTOPERATIVE DIAGNOSIS:  Unstable angina with coronary occlusive disease.  SURGICAL PROCEDURE:  Coronary artery bypass grafting x4 with the left internal mammary to the left anterior descending coronary artery, reverse saphenous vein graft to the distal right coronary artery, reverse saphenous vein graft to the diagonal coronary artery and reverse saphenous vein graft to the circumflex coronary artery with right greater saphenous thigh and calf endo-vein harvesting and placement of right femoral arterial line.  SURGEON:  Sheliah Plane, MD  FIRST ASSISTANT:  Doree Fudge, PA  BRIEF HISTORY:  The patient is a 66 year old male, who while visiting from [place], began having unstable anginal symptoms and was seen in the emergency room and discharged home, return to the Cardiology Office and was admitted for cardiac catheterization, which revealed a very high-grade complex proximal LAD lesion of greater than 95%, also on holding the diagonal.  In addition, there was proximal circumflex disease of 60%.  The patient had eccentric plaque of 60% or greater in the ostium of the right coronary artery.  Overall, ventricular function was preserved.  Because of the patient's symptoms and high-grade LAD diagonal disease and other concomitant disease, coronary artery bypass grafting was recommended.  The patient agreed and signed informed consent.  DESCRIPTION OF PROCEDURE:  With Swan-Ganz and arterial line monitors in place, the patient underwent general endotracheal anesthesia without incident.  Skin of  the chest and legs was prepped with Betadine and draped in usual sterile manner.  Appropriate time-out was performed.  We first proceeded with right greater saphenous thigh and calf endovein harvesting with the endoscopic system.  The vein was of adequate quality and caliber.  Median sternotomy was performed.  Left internal mammary artery was dissected down as pedicle graft.  The distal artery was divided, had good free flow.  Pericardium was opened.  Overall, ventricular function appeared preserved.  The patient was systemically heparinized.  The ascending aorta was cannulated.  The right atrium was cannulated.  An aortic root vent cardioplegia needle was introduced into the ascending aorta.  The patient was placed on cardiopulmonary bypass 2.4 L/min/m2.  Sites of anastomosis were selected and dissected out of the epicardium.  The patient's body temperature was cooled to 32 degrees.  Aortic crossclamp was applied and 600 mL of cold blood potassium cardioplegia was administered with diastolic arrest of the heart.  Myocardial septal temperature was monitored throughout the crossclamp.  Turned our attention first to the distal right coronary artery, which was opened, this was large vessel, over 2-mm incised using a running 7-0 Prolene.  Distal anastomosis was performed with a segment of reverse saphenous vein graft.  Heart was then elevated and the small circumflex branch was partially intramyocardial.  This vessel  was opened and admitted a 1.5-mm probe using a running 7-0 Prolene.  Distal anastomosis was performed.  Additional cold blood cardioplegia was administered down the vein graft.  The diagonal coronary artery was identified and opened, was a 1.2-mm vessel using a running 7-0 Prolene, distal anastomosis was performed.  The left anterior descending coronary artery was opened in the midportion.  The vessel was 1.2-1.3 mm in size. Using running 8-0 Prolene, the left internal mammary  artery was anastomosed to left anterior descending coronary artery.  With the crossclamp still in place, three punch aortotomies were performed and each of the three vein grafts were anastomosed to the ascending aorta. The bulldog was removed from the mammary artery with prompt rise in myocardial septal temperature.  The aortic crossclamp was removed after de-airing the ascending aorta.  Total crossclamp time was 94 minutes. The patient was spontaneously converted to a sinus rhythm.  Sites of anastomosis were inspected and were free of bleeding.  Atrial and ventricular pacing wires were applied.  At this point, as we were weaning from cardiopulmonary bypass, the right radial arterial line was not functioned.  A percutaneous stick of the right femoral artery was performed and a guidewire passed through the needle.  Over this guidewire, a single-lumen central line was placed into the femoral artery and used as arterial line.  The patient was then ventilated and weaned from cardiopulmonary bypass without difficulty.  He remained hemodynamically stable.  He was decannulated in usual fashion. Protamine sulfate was administered with operative field hemostatic.  A left pleural tube and a Blake mediastinal drain were left in place. Pericardium was loosely reapproximated.  The sternum was closed with #6 stainless steel wire.  Fascia was closed with interrupted 0 Vicryl, running 3-0 Vicryl in the subcutaneous tissue.  Sponge and needle counts were reported as correct at the completion of procedure.  Total pump time was 131 minutes.  The patient was transferred to Surgical Intensive care Unit for further postoperative care.  He did not require any blood bank blood products during the operative procedure.     Sheliah PlaneEdward Marton Malizia, MD     EG/MEDQ  D:  01/14/2017  T:  01/14/2017  Job:  [id number]

## 2017-01-15 NOTE — Progress Notes (Signed)
Removed wires with no resistance at 1215. Pt. Tolerated procedure well. Bed rest 1hr with q15 VS

## 2017-01-15 NOTE — Progress Notes (Signed)
CARDIAC REHAB PHASE I   PRE:  Rate/Rhythm: 86 SR  BP:  Sitting: 128/74        SaO2: 96 RA  MODE:  Ambulation: 790 ft   POST:  Rate/Rhythm: 89 SR  BP:  Sitting: 132/90         SaO2: 97 RA  Pt ambulated 790 ft on RA, rolling walker, independent, steady gait, tolerated well with no complaints. Pt more independent today, able to walk and talk comfortably. Encouraged additional ambulation x2 today. Pt to bed per pt request after walk, call bell within reach. Will follow up tomorrow.   [phone] Joylene GrapesEmily C Austyn Seier, RN, BSN 01/15/2017 2:18 PM

## 2017-01-16 MED ORDER — LISINOPRIL 2.5 MG PO TABS: 2.5000 mg | ORAL_TABLET | Freq: Every day | ORAL | 1 refills | Status: DC

## 2017-01-16 MED ORDER — POTASSIUM CHLORIDE CRYS ER 20 MEQ PO TBCR: 20.0000 meq | EXTENDED_RELEASE_TABLET | Freq: Every day | ORAL | 0 refills | Status: DC

## 2017-01-16 MED ORDER — ATORVASTATIN CALCIUM 80 MG PO TABS: 80.0000 mg | ORAL_TABLET | Freq: Every day | ORAL | 1 refills | Status: DC

## 2017-01-16 MED ORDER — LISINOPRIL 2.5 MG PO TABS
2.5000 mg | ORAL_TABLET | Freq: Every day | ORAL | Status: DC
  Administered 2017-01-16: 2.5 mg via ORAL
  Filled 2017-01-16: qty 1

## 2017-01-16 MED ORDER — ACETAMINOPHEN 325 MG PO TABS: 650.0000 mg | ORAL_TABLET | Freq: Four times a day (QID) | ORAL | Status: DC | PRN

## 2017-01-16 MED ORDER — LACTULOSE 10 GM/15ML PO SOLN
20.0000 g | Freq: Once | ORAL | Status: AC
  Administered 2017-01-16: 20 g via ORAL
  Filled 2017-01-16: qty 30

## 2017-01-16 MED ORDER — OXYCODONE HCL 5 MG PO TABS: ORAL_TABLET | ORAL | 0 refills | Status: DC

## 2017-01-16 MED ORDER — METOPROLOL TARTRATE 25 MG PO TABS: 12.5000 mg | ORAL_TABLET | Freq: Two times a day (BID) | ORAL | 1 refills | Status: DC

## 2017-01-16 MED ORDER — FUROSEMIDE 40 MG PO TABS: 40.0000 mg | ORAL_TABLET | Freq: Every day | ORAL | 0 refills | Status: DC

## 2017-01-16 NOTE — Progress Notes (Signed)
Pt. Discharged to home via wheelchair Pt. D/C'd  Discharge information reviewed and given including Rx  All personal belongings given to Pt.  Education discussed with teach back  IV was d/c Tele d/c

## 2017-01-16 NOTE — Progress Notes (Signed)
      [address]       [place] [id number]             [phone]        5 Days Post-Op Procedure(s) (LRB): CORONARY ARTERY BYPASS GRAFTING (CABG) x 4 (LIMA to LAD, SVG to DIAGONAL, SVG to OM, SVG to RCA) with ENDOVEIN HARVEST OF RIGHT GREATER SAPHENOUS VEIN (N/A) TRANSESOPHAGEAL ECHOCARDIOGRAM (TEE) (N/A) ENDOVEIN HARVEST OF GREATER SAPHENOUS VEIN (Right) FEMORAL ARTERIAL LINE INSERTION ;16 GAUGE (Right)  Subjective: Patient has not had bowel movement yet but is passing flatus  Objective: Vital signs in last 24 hours: Temp:  [98.5 F (36.9 C)-99.6 F (37.6 C)] 99.6 F (37.6 C) (02/28 0429) Pulse Rate:  [75-92] 82 (02/28 0429) Cardiac Rhythm: Normal sinus rhythm (02/27 1900) Resp:  [18] 18 (02/28 0429) BP: (123-135)/(65-81) 133/81 (02/28 0429) SpO2:  [94 %-100 %] 94 % (02/28 0429) Weight:  [217 lb 14.4 oz (98.8 kg)] 217 lb 14.4 oz (98.8 kg) (02/28 0429)  Pre op weight 96.7 kg Current Weight  01/16/17 217 lb 14.4 oz (98.8 kg)      Intake/Output from previous day: 02/27 0701 - 02/28 0700 In: -  Out: 2100 [Urine:2100]   Physical Exam:  Cardiovascular: RRR Pulmonary: Mostly clear Abdomen: Soft, non tender, bowel sounds present. Extremities: Mild bilateral lower extremity edema. Wounds: Wounds are clean and dry.  No erythema or signs of infection.  Lab Results: CBC:  Recent Labs  01/14/17 0213  WBC 11.0*  HGB 10.7*  HCT 32.4*  PLT 145*   BMET:   Recent Labs  01/14/17 0213  NA 136  K 4.0  CL 105  CO2 25  GLUCOSE 114*  BUN 12  CREATININE 0.90  CALCIUM 8.4*    PT/INR:  Lab Results  Component Value Date   INR 1.39 01/11/2017   INR 1.11 01/10/2017   INR 1.0 01/08/2017   ABG:  INR: Will add last result for INR, ABG once components are confirmed Will add last 4 CBG results once components are confirmed  Assessment/Plan:  1. CV - SR in the 70's. On Lopressor 12.5 mg bid. Will start low dose ACE. 2.  Pulmonary - On room  air. CXR yesterday showed tiny left apical pneumotothorax, small left pleural effusion, and basilar atelectasis. Encourage incentive spirometer. 3. Volume Overload - Continue Lasix 40 mg daily 4.  Acute blood loss anemia - H and H yesterday 10.7 and 32.4 5. Mild thrombocytopenia-platelets 145,000 6. LOC this am 7. Will discharge after bm  ZIMMERMAN,DONIELLE MPA-C 01/16/2017,7:21 AM

## 2017-01-16 NOTE — Care Management Note (Signed)
Case Management Note Donn PieriniKristi Kellie Murrill RN, BSN Unit 2W-Case Manager [phone]  Patient Details  Name: Tyler FinesVictor Macomber Cole MRN: [id number] Date of Birth: Oct 14, 1951  Subjective/Objective:   Pt admitted with [place], s/p  CABG x4                Action/Plan: PTA [place] lived at home[place] care of sister's estate- plans to stay here for f/u then [place] - No CM needs for d/c noted.   Expected Discharge Date:  01/16/17               Expected Discharge Plan:  Home/Self Care  In-House Referral:     Discharge planning Services  CM Consult  Post Acute Care Choice:  NA Choice offered to:  NA  DME Arranged:    DME Agency:     HH Arranged:    HH Agency:     Status of Service:  Completed, signed off  If discussed at Long Length of Stay Meetings, dates discussed:  2/27  Additional Comments:  Darrold SpanWebster, Farmer Mccahill Hall, RN 01/16/2017, 2:47 PM

## 2017-01-16 NOTE — Progress Notes (Signed)
CARDIAC REHAB PHASE I   PRE:  Rate/Rhythm: 86 SR  BP:  Sitting: 128/74        SaO2: 96 RA  MODE:  Ambulation: 790 ft   POST:  Rate/Rhythm: 101 ST  BP:  Sitting: 139/76         SaO2: 99 RA  Pt ambulated 790 ft on RA, independent, steady gait, tolerated well with no complaints. Cardiac surgery discharge education completed with pt and wife at bedside. Reviewed IS, sternal precautions, activity progression, exercise, heart healthy diet, daily weights and phase 2 cardiac rehab. Pt verbalized understanding. [place] he plans to be here until June. Pt agrees to phase 2 cardiac rehab referral, [facility]. Pt to recliner after walk, call bell within reach. [phone] Joylene GrapesEmily C Masyn Rostro, RN, BSN 01/16/2017 12:01 PM

## 2017-01-17 ENCOUNTER — Telehealth (HOSPITAL_COMMUNITY): Payer: Self-pay | Admitting: General Practice

## 2017-01-17 NOTE — Telephone Encounter (Signed)
Verified Designer, television/film setnsurance Benefits through [employer], Pt has Medicare A/B primary and BCBS [place] Secondary Supplement. Must follow Medicare Guidelines.  Reference # [id number].... KJ

## 2017-01-23 ENCOUNTER — Ambulatory Visit: Payer: Medicare Other | Admitting: Physician Assistant

## 2017-01-24 NOTE — Progress Notes (Deleted)
Cardiology Office Note    Date:  01/24/2017   ID:  [name], DOB 09-22-1951, MRN [id number]  PCP:  No PCP Per Patient  Cardiologist:  Dr. Anne Fu  CC: follow up post CABG   History of Present Illness:  [name] is a 66 y.o. male with a history of tobacco abuse, HTN, HLD, PAF (remoted hx of in 1990 and 2004) and recently diagnosed CAD s/p CABGx4V  who presents to clinic for post hospital follow up.   He was seen by Dr. Anne Fu in the office on 01/08/17 for chest pain concerning for [place]. He was set up for cath on 01/09/17 which showed severe proximal LAD lesion and several other blockages in the 50-55% range. He was referred for surgical revascularization. Dr. Tyrone Sage performed CABG x4V (LIMA-->LAD, SVG--> diag, SVG--> OM, SVG--> RCA) on 01/11/17. He tolerated the procedure well with some post op volume overload requiring diuresis.   Today he presents to clinic for follow up.    Past Medical History:  Diagnosis Date  . Chest pain in adult 01/09/2017  . Renal disorder    born with 1 funcitoning kidney    Past Surgical History:  Procedure Laterality Date  . ARTERIAL LINE INSERTION Right 01/11/2017   Procedure: FEMORAL ARTERIAL LINE INSERTION ;16 GAUGE;  Surgeon: [name], MD;  Location: [facility] OR;  Service: Open Heart Surgery;  Laterality: Right;  . CORONARY ARTERY BYPASS GRAFT N/A 01/11/2017   Procedure: CORONARY ARTERY BYPASS GRAFTING (CABG) x 4 (LIMA to LAD, SVG to DIAGONAL, SVG to OM, SVG to RCA) with ENDOVEIN HARVEST OF RIGHT GREATER SAPHENOUS VEIN;  Surgeon: [name], MD;  Location: [facility] OR;  Service: Open Heart Surgery;  Laterality: N/A;  . ENDOVEIN HARVEST OF GREATER SAPHENOUS VEIN Right 01/11/2017   Procedure: ENDOVEIN HARVEST OF GREATER SAPHENOUS VEIN;  Surgeon: [name], MD;  Location: [facility] OR;  Service: Open Heart Surgery;  Laterality: Right;  . LEFT HEART CATH AND CORONARY ANGIOGRAPHY N/A 01/09/2017   Procedure: Left Heart Cath and  Coronary Angiography;  Surgeon: Lyn Records, MD;  Location: [facility] INVASIVE CV LAB;  Service: Cardiovascular;  Laterality: N/A;  . SPHENOIDECTOMY    . TEE WITHOUT CARDIOVERSION N/A 01/11/2017   Procedure: TRANSESOPHAGEAL ECHOCARDIOGRAM (TEE);  Surgeon: [name], MD;  Location: [facility] OR;  Service: Open Heart Surgery;  Laterality: N/A;    Current Medications: Outpatient Medications Prior to Visit  Medication Sig Dispense Refill  . acetaminophen (TYLENOL) 325 MG tablet Take 2 tablets (650 mg total) by mouth every 6 (six) hours as needed for mild pain.    Marland Kitchen aspirin EC 81 MG tablet Take 81 mg by mouth daily.    Marland Kitchen atorvastatin (LIPITOR) 80 MG tablet Take 1 tablet (80 mg total) by mouth daily at 6 PM. 30 tablet 1  . cetirizine (ZYRTEC) 10 MG tablet Take 10 mg by mouth daily.    . Cholecalciferol (VITAMIN D-3) 5000 units TABS Take 1,500 Units by mouth daily.    . furosemide (LASIX) 40 MG tablet Take 1 tablet (40 mg total) by mouth daily. For 5 days then stop. 5 tablet 0  . lisinopril (PRINIVIL,ZESTRIL) 2.5 MG tablet Take 1 tablet (2.5 mg total) by mouth daily. 30 tablet 1  . metoprolol tartrate (LOPRESSOR) 25 MG tablet Take 0.5 tablets (12.5 mg total) by mouth 2 (two) times daily. 30 tablet 1  . Multiple Vitamin (MULTIVITAMIN WITH MINERALS) TABS tablet Take 1 tablet by mouth daily.    Marland Kitchen  oxyCODONE (OXY IR/ROXICODONE) 5 MG immediate release tablet Please take 5 mg by mouth every 4-6 hours PRN severe pain. 30 tablet 0  . potassium chloride SA (K-DUR,KLOR-CON) 20 MEQ tablet Take 1 tablet (20 mEq total) by mouth daily. For 5 days then stop. 5 tablet 0   No facility-administered medications prior to visit.      Allergies:   Halibut liver oil [fish oil] and Shrimp [shellfish allergy]   Social History   Social History  . Marital status: Divorced    Spouse name: N/A  . Number of children: N/A  . Years of education: N/A   Social History Main Topics  . Smoking status: Never Smoker  . Smokeless  tobacco: Never Used  . Alcohol use No  . Drug use: No  . Sexual activity: Not on file   Other Topics Concern  . Not on file   Social History Narrative  . No narrative on file     Family History:  The patient's ***family history is not on file.      *** ROS/PE    Wt Readings from Last 3 Encounters:  01/16/17 217 lb 14.4 oz (98.8 kg)  01/08/17 220 lb 6.4 oz (100 kg)  01/05/17 220 lb (99.8 kg)      Studies/Labs Reviewed:   EKG:  EKG is*** ordered today.  The ekg ordered today demonstrates ***  Recent Labs: 01/10/2017: ALT 25 01/12/2017: Magnesium 2.4 01/14/2017: BUN 12; Creatinine, Ser 0.90; Hemoglobin 10.7; Platelets 145; Potassium 4.0; Sodium 136   Lipid Panel No results found for: CHOL, TRIG, HDL, CHOLHDL, VLDL, LDLCALC, LDLDIRECT  Additional studies/ records that were reviewed today include:  Cath 01/09/17 Procedures  Left Heart Cath and Coronary Angiography  Conclusion    Ostial to proximal 50% region of stenosis in the LAD followed by proximal to mid 99% stenosis distal to a large first diagonal. First agonal eccentric 50-60% stenosis.  Diffuse generalized narrowing of the left main, less than 50%  Ostial to proximal eccentric 50% right coronary.  Normal LV function. EF 65%.   RECOMMENDATIONS:   After considering treatment options, we will discuss with TCTS. I am concerned with treating the proximal to mid LAD 99% lesion and leaving behind ostial disease. Best treatment option in this vigorous outdoorsman would be LIMA to LAD and SVG to diagonal. Discuss whether the right and circumflex should be grafted.  Keep in the hospital.  ACS dose Lovenox.   2D ECHO: 01/10/2017 LV EF: 60% -   65% Study Conclusions - Left ventricle: The cavity size was normal. Wall thickness was   increased in a pattern of moderate LVH. Systolic function was   normal. The estimated ejection fraction was in the range of 60%   to 65%. Wall motion was normal; there were no  regional wall   motion abnormalities. Doppler parameters are consistent with   abnormal left ventricular relaxation (grade 1 diastolic   dysfunction). Doppler parameters are consistent with   indeterminate ventricular filling pressure. - Aortic valve: Transvalvular velocity was within the normal range.   There was no stenosis. There was no regurgitation. Valve area   (VTI): 1.62 cm^2. Valve area (Vmax): 1.65 cm^2. Valve area   (Vmean): 1.6 cm^2. - Mitral valve: Transvalvular velocity was within the normal range.   There was no evidence for stenosis. There was no regurgitation.   Valve area by pressure half-time: 1.95 cm^2. - Right ventricle: The cavity size was normal. Wall thickness was   normal. Systolic function  was normal. - Atrial septum: No defect or patent foramen ovale was identified   by color flow Doppler. - Tricuspid valve: There was no regurgitation.   ASSESSMENT & PLAN:   CAD s/p CABG x4V:  HTN:  HLD:  PAF (remoted hx of in 1990 and 2004):  Tobacco abuse:  Medication Adjustments/Labs and Tests Ordered: Current medicines are reviewed at length with the patient today.  Concerns regarding medicines are outlined above.  Medication changes, Labs and Tests ordered today are listed in the Patient Instructions below. There are no Patient Instructions on file for this visit.   Signed, Cline Crock, PA-C  01/24/2017 8:59 AM    [facility] [address] [place], [place], [place]  [address] Phone: [phone]; Fax: [phone]

## 2017-01-28 NOTE — Progress Notes (Signed)
Cardiology Office Note   Date:  01/29/2017   ID:  [name], DOB September 06, 1951, MRN [id number]  PCP:  No PCP Per Patient  Cardiologist:  Dr. Anne Fu    Chief Complaint  Patient presents with  . Hospitalization Follow-up    Post CABG      History of Present Illness:  [name] is a 66 y.o. male who presents for post hospitalization for CABG.  He has a hx of HTN and HLD and single functioning kidney.   Pt was seen in the office and due to unstable angina cardiac cath was arranged.   Cath revealed  Diffuse generalized narrowing of the left main, less than 50%.  Ostial to proximal 50% region of stenosis in the LAD followed by proximal to mid 99% stenosis distal to a large first diagonal. First agonal eccentric 50-60% stenosis,Ostial to proximal eccentric 50% right coronary and Normal LV function. EF 65%.  There was concern that treating the proximal to mid LAD 99% lesion and leaving behind ostial disease. Best treatment option in this vigorous outdoorsman would be LIMA to LAD and SVG to diagonal. Discuss whether the right and circumflex should be grafted.  TCTS was consulted and they agreed with Dr. Katrinka Blazing with complex anatomy CABG would offer best long term treatment.   On 01/11/17 pt underwent CABG X 4 with LIMA to LAD, VG to diag, VG to OM, and VG to RCA. Pt did well post op and was discharged 01/16/17.    Today no chest pain or SOB.  He is walking 2-3 times a day 10-15 min at a time.  His wt is down to 205, he does eat 3 meals per day but not really hungry.   No drainage at incisions,  Takes prn tylenol for pain.  Off the lasix and K+.  occ episodes of his "heart stops" and post op he had some PACs on tele.  He did have SVT with urgent care visit prior to cath.  Also hx of PAF years ago after stopping/cutting back on caffeine.       Past Medical History:  Diagnosis Date  . Chest pain in adult 01/09/2017  . Coronary artery disease   . Renal disorder    born with 1  funcitoning kidney    Past Surgical History:  Procedure Laterality Date  . ARTERIAL LINE INSERTION Right 01/11/2017   Procedure: FEMORAL ARTERIAL LINE INSERTION ;16 GAUGE;  Surgeon: [name], MD;  Location: [facility] OR;  Service: Open Heart Surgery;  Laterality: Right;  . CARDIAC CATHETERIZATION    . CORONARY ARTERY BYPASS GRAFT N/A 01/11/2017   Procedure: CORONARY ARTERY BYPASS GRAFTING (CABG) x 4 (LIMA to LAD, SVG to DIAGONAL, SVG to OM, SVG to RCA) with ENDOVEIN HARVEST OF RIGHT GREATER SAPHENOUS VEIN;  Surgeon: [name], MD;  Location: [facility] OR;  Service: Open Heart Surgery;  Laterality: N/A;  . ENDOVEIN HARVEST OF GREATER SAPHENOUS VEIN Right 01/11/2017   Procedure: ENDOVEIN HARVEST OF GREATER SAPHENOUS VEIN;  Surgeon: [name], MD;  Location: [facility] OR;  Service: Open Heart Surgery;  Laterality: Right;  . LEFT HEART CATH AND CORONARY ANGIOGRAPHY N/A 01/09/2017   Procedure: Left Heart Cath and Coronary Angiography;  Surgeon: Lyn Records, MD;  Location: [facility] INVASIVE CV LAB;  Service: Cardiovascular;  Laterality: N/A;  . SPHENOIDECTOMY    . TEE WITHOUT CARDIOVERSION N/A 01/11/2017   Procedure: TRANSESOPHAGEAL ECHOCARDIOGRAM (TEE);  Surgeon: [name], MD;  Location: [facility] OR;  Service: Open Heart Surgery;  Laterality: N/A;     Current Outpatient Prescriptions  Medication Sig Dispense Refill  . acetaminophen (TYLENOL) 325 MG tablet Take 2 tablets (650 mg total) by mouth every 6 (six) hours as needed for mild pain.    Marland Kitchen. aspirin EC 81 MG tablet Take 81 mg by mouth daily.    Marland Kitchen. atorvastatin (LIPITOR) 80 MG tablet Take 1 tablet (80 mg total) by mouth daily at 6 PM. 30 tablet 1  . cetirizine (ZYRTEC) 10 MG tablet Take 10 mg by mouth daily.    . Cholecalciferol (VITAMIN D-3) 5000 units TABS Take 1,500 Units by mouth daily.    . furosemide (LASIX) 40 MG tablet Take 1 tablet (40 mg total) by mouth daily. For 5 days then stop. 5 tablet 0  . lisinopril (PRINIVIL,ZESTRIL) 2.5 MG  tablet Take 1 tablet (2.5 mg total) by mouth daily. 30 tablet 1  . metoprolol tartrate (LOPRESSOR) 25 MG tablet Take 0.5 tablets (12.5 mg total) by mouth 2 (two) times daily. 30 tablet 1  . Multiple Vitamin (MULTIVITAMIN WITH MINERALS) TABS tablet Take 1 tablet by mouth daily.    Marland Kitchen. oxyCODONE (OXY IR/ROXICODONE) 5 MG immediate release tablet Please take 5 mg by mouth every 4-6 hours PRN severe pain. 30 tablet 0  . potassium chloride SA (K-DUR,KLOR-CON) 20 MEQ tablet Take 1 tablet (20 mEq total) by mouth daily. For 5 days then stop. 5 tablet 0   No current facility-administered medications for this visit.     Allergies:   Halibut liver oil [fish oil] and Shrimp [shellfish allergy]    Social History:  The patient  reports that he has never smoked. He has never used smokeless tobacco. He reports that he does not drink alcohol or use drugs.   Family History:  The patient's family history includes Cancer in his father; Heart failure (age of onset: 7395) in his mother.    ROS:  General:no colds or fevers, + weight loss with surgery/hospitalization  Skin:no rashes or ulcers HEENT:no blurred vision, no congestion CV:see HPI PUL:see HPI GI:no diarrhea constipation or melena, no indigestion GU:no hematuria, no dysuria MS:no joint pain, no claudication Neuro:no syncope, no lightheadedness Endo:no diabetes, no thyroid disease  Wt Readings from Last 3 Encounters:  01/29/17 205 lb 6.4 oz (93.2 kg)  01/16/17 217 lb 14.4 oz (98.8 kg)  01/08/17 220 lb 6.4 oz (100 kg)     PHYSICAL EXAM: VS:  BP 112/82   Pulse 83   Ht 6\' 2"  (1.88 m)   Wt 205 lb 6.4 oz (93.2 kg)   BMI 26.37 kg/m  , BMI Body mass index is 26.37 kg/m. General:Pleasant affect, NAD Skin:Warm and dry, brisk capillary refill HEENT:normocephalic, sclera clear, mucus membranes moist Neck:supple, no JVD, no bruits  Heart:S1S2 RRR without murmur, gallup, rub or click Lungs:clear without rales, rhonchi, or wheezes ZOX:WRUEAbd:soft, non tender,  + BS, do not palpate liver spleen or masses Ext:no lower ext edema, 2+ pedal pulses, 2+ radial pulses Neuro:alert and oriented X 3, MAE, follows commands, + facial symmetry    EKG:  EKG is ordered today. The ekg ordered today demonstrates SR normal EKG and ST elevation has resolved from hospital.    Recent Labs: 01/10/2017: ALT 25 01/12/2017: Magnesium 2.4 01/14/2017: BUN 12; Creatinine, Ser 0.90; Hemoglobin 10.7; Platelets 145; Potassium 4.0; Sodium 136    Lipid Panel No results found for: CHOL, TRIG, HDL, CHOLHDL, VLDL, LDLCALC, LDLDIRECT     Other studies Reviewed: Additional studies/ records  that were reviewed today include: . 01/09/17 Procedures   Left Heart Cath and Coronary Angiography  Conclusion    Ostial to proximal 50% region of stenosis in the LAD followed by proximal to mid 99% stenosis distal to a large first diagonal. First agonal eccentric 50-60% stenosis.  Diffuse generalized narrowing of the left main, less than 50%  Ostial to proximal eccentric 50% right coronary.  Normal LV function. EF 65%.   RECOMMENDATIONS:   After considering treatment options, we will discuss with TCTS. I am concerned with treating the proximal to mid LAD 99% lesion and leaving behind ostial disease. Best treatment option in this vigorous outdoorsman would be LIMA to LAD and SVG to diagonal. Discuss whether the right and circumflex should be grafted.  Keep in the hospital.  ACS dose Lovenox.  Indications   ACS (acute coronary syndrome) (HCC) [I24.9 (ICD-10-CM)]  CAD in native artery [I25.10 (ICD-10-CM)]   ECHO 01/10/17 Study Conclusions  - Left ventricle: The cavity size was normal. Wall thickness was   increased in a pattern of moderate LVH. Systolic function was   normal. The estimated ejection fraction was in the range of 60%   to 65%. Wall motion was normal; there were no regional wall   motion abnormalities. Doppler parameters are consistent with   abnormal left  ventricular relaxation (grade 1 diastolic   dysfunction). Doppler parameters are consistent with   indeterminate ventricular filling pressure. - Aortic valve: Transvalvular velocity was within the normal range.   There was no stenosis. There was no regurgitation. Valve area   (VTI): 1.62 cm^2. Valve area (Vmax): 1.65 cm^2. Valve area   (Vmean): 1.6 cm^2. - Mitral valve: Transvalvular velocity was within the normal range.   There was no evidence for stenosis. There was no regurgitation.   Valve area by pressure half-time: 1.95 cm^2. - Right ventricle: The cavity size was normal. Wall thickness was   normal. Systolic function was normal. - Atrial septum: No defect or patent foramen ovale was identified   by color flow Doppler. - Tricuspid valve: There was no regurgitation.   Arterial dopplers:  01/10/17 Summary:  - The vertebral arteries appear patent with antegrade flow. - Findings consistent with 1-39 percent stenosis involving the   right internal carotid artery and the left internal carotid   artery. - Pedal artery waveforms within normal limits.   CABG 01/11/17 SURGICAL PROCEDURE:  Coronary artery bypass grafting x4 with the left internal mammary to the left anterior descending coronary artery, reverse saphenous vein graft to the distal right coronary artery, reverse saphenous vein graft to the diagonal coronary artery and reverse saphenous vein graft to the circumflex coronary artery with right greater saphenous thigh and calf endo-vein harvesting and placement of right femoral arterial line.   ASSESSMENT AND PLAN:  1.  CAD with CABG X 4.  normla LV function.  Today.is doing well, no SOB or edema.  Is exercising.  Wt down due to decreased appetite.  Will send in to cardiac rehab to begin.  Follow up with Dr. Anne Fu in 4-6 weeks.  2. HTN well controlled  3.  HLD on lipitor 80 recheck lipids on next visit  4.  Blood loss anemia. Check CBC  5.  Single functioning  kidney, check BMP.    6. Skipped Heart beats, none for several days now, PACs with hospitalization, if they return will add holter to capture with his remote hx of P a fib and recent SVT.   Current medicines are  reviewed with the patient today.  The patient Has no concerns regarding medicines.  The following changes have been made:  See above Labs/ tests ordered today include:see above  Disposition:   FU:  see above  Signed, Nada Boozer, NP  01/29/2017 11:00 AM    [facility] [address] [place], [place], [place]  [address]/ [address] [place], [place] Phone: [phone]; Fax: [phone]  [phone]

## 2017-01-29 ENCOUNTER — Ambulatory Visit (INDEPENDENT_AMBULATORY_CARE_PROVIDER_SITE_OTHER): Payer: Medicare Other | Admitting: Cardiology

## 2017-01-29 ENCOUNTER — Ambulatory Visit: Payer: Medicare Other | Admitting: Physician Assistant

## 2017-01-29 ENCOUNTER — Encounter: Payer: Self-pay | Admitting: Cardiology

## 2017-01-29 VITALS — BP 112/82 | HR 83 | Ht 74.0 in | Wt 205.4 lb

## 2017-01-29 DIAGNOSIS — Z951 Presence of aortocoronary bypass graft: Secondary | ICD-10-CM

## 2017-01-29 DIAGNOSIS — E782 Mixed hyperlipidemia: Secondary | ICD-10-CM | POA: Diagnosis not present

## 2017-01-29 DIAGNOSIS — I2510 Atherosclerotic heart disease of native coronary artery without angina pectoris: Secondary | ICD-10-CM | POA: Diagnosis not present

## 2017-01-29 DIAGNOSIS — I200 Unstable angina: Secondary | ICD-10-CM | POA: Diagnosis not present

## 2017-01-29 DIAGNOSIS — Z09 Encounter for follow-up examination after completed treatment for conditions other than malignant neoplasm: Secondary | ICD-10-CM

## 2017-01-29 DIAGNOSIS — D649 Anemia, unspecified: Secondary | ICD-10-CM

## 2017-01-29 DIAGNOSIS — I10 Essential (primary) hypertension: Secondary | ICD-10-CM | POA: Diagnosis not present

## 2017-01-29 LAB — CBC
HEMATOCRIT: 36.8 % — AB (ref 37.5–51.0)
HEMOGLOBIN: 12.7 g/dL — AB (ref 13.0–17.7)
MCH: 31.2 pg (ref 26.6–33.0)
MCHC: 34.5 g/dL (ref 31.5–35.7)
MCV: 90 fL (ref 79–97)
Platelets: 462 10*3/uL — ABNORMAL HIGH (ref 150–379)
RBC: 4.07 x10E6/uL — AB (ref 4.14–5.80)
RDW: 13.5 % (ref 12.3–15.4)
WBC: 8.7 10*3/uL (ref 3.4–10.8)

## 2017-01-29 LAB — BASIC METABOLIC PANEL
BUN / CREAT RATIO: 22 (ref 10–24)
BUN: 19 mg/dL (ref 8–27)
CALCIUM: 10.2 mg/dL (ref 8.6–10.2)
CO2: 23 mmol/L (ref 18–29)
CREATININE: 0.87 mg/dL (ref 0.76–1.27)
Chloride: 96 mmol/L (ref 96–106)
GFR calc Af Amer: 103 mL/min/{1.73_m2} (ref >59)
GFR calc non Af Amer: 89 mL/min/{1.73_m2} (ref >59)
GLUCOSE: 102 mg/dL — AB (ref 65–99)
Potassium: 4.6 mmol/L (ref 3.5–5.2)
Sodium: 135 mmol/L (ref 134–144)

## 2017-01-29 NOTE — Patient Instructions (Signed)
Nada BoozerLaura Ingold, NP, recommends that you continue on your current medications as directed. Please refer to the Current Medication list given to you today.  Your physician recommends that you return for lab work TODAY.  Vernona RiegerLaura recommends that you schedule a follow-up appointment in 4-6 weeks with Dr Anne FuSkains.  If you need a refill on your cardiac medications before your next appointment, please call your pharmacy.

## 2017-02-08 ENCOUNTER — Other Ambulatory Visit: Payer: Self-pay | Admitting: Cardiothoracic Surgery

## 2017-02-08 DIAGNOSIS — Z951 Presence of aortocoronary bypass graft: Secondary | ICD-10-CM

## 2017-02-14 ENCOUNTER — Ambulatory Visit
Admission: RE | Admit: 2017-02-14 | Discharge: 2017-02-14 | Disposition: A | Discharge status: Home / Self Care | Payer: Medicare Other | Source: Ambulatory Visit | Attending: Cardiothoracic Surgery | Admitting: Cardiothoracic Surgery

## 2017-02-14 ENCOUNTER — Ambulatory Visit (INDEPENDENT_AMBULATORY_CARE_PROVIDER_SITE_OTHER): Payer: Self-pay | Admitting: Cardiothoracic Surgery

## 2017-02-14 ENCOUNTER — Encounter: Payer: Self-pay | Admitting: Cardiothoracic Surgery

## 2017-02-14 ENCOUNTER — Encounter: Payer: Self-pay | Admitting: Cardiology

## 2017-02-14 VITALS — BP 107/68 | Resp 16 | Ht 74.0 in | Wt 205.0 lb

## 2017-02-14 DIAGNOSIS — Z951 Presence of aortocoronary bypass graft: Secondary | ICD-10-CM

## 2017-02-14 DIAGNOSIS — I2510 Atherosclerotic heart disease of native coronary artery without angina pectoris: Secondary | ICD-10-CM

## 2017-02-14 NOTE — Progress Notes (Signed)
[address]       [place] [address]             [phone]      [name] Medical Record #[id number] Date of Birth: [birth year]  Referring: Lyn Records, MD Primary Care: No PCP Per Patient  Chief Complaint:   POST OP FOLLOW UP 01/11/2017  OPERATIVE REPORT PREOPERATIVE DIAGNOSIS:  Unstable angina with coronary occlusive disease. POSTOPERATIVE DIAGNOSIS:  Unstable angina with coronary occlusive disease. SURGICAL PROCEDURE:  Coronary artery bypass grafting x4 with the left internal mammary to the left anterior descending coronary artery, reverse saphenous vein graft to the distal right coronary artery, reverse saphenous vein graft to the diagonal coronary artery and reverse saphenous vein graft to the circumflex coronary artery with right greater saphenous thigh and calf endo-vein harvesting and placement of right femoral arterial line. SURGEON:  Sheliah Plane, MD  History of Present Illness:     Patient is doing well following surgery, returning to near-normal activities without angina. He is to start in cardiac rehabilitation next week. He is not taking any pain medication since discharge home.      Past Medical History:  Diagnosis Date  . Chest pain in adult 01/09/2017  . Coronary artery disease   . Renal disorder    born with 1 funcitoning kidney     History  Smoking Status  . Never Smoker  Smokeless Tobacco  . Never Used    History  Alcohol Use No     Allergies  Allergen Reactions  . Halibut Liver Oil [Fish Oil] Other (See Comments)    Halibut fish causes runny nose  . Shrimp [Shellfish Allergy] Other (See Comments)    Causes runny nose    Current Outpatient Prescriptions  Medication Sig Dispense Refill  . acetaminophen (TYLENOL) 325 MG tablet Take 2 tablets (650 mg total) by mouth every 6 (six) hours as needed for mild pain.    Marland Kitchen aspirin EC 81 MG tablet Take 81 mg by mouth daily.    Marland Kitchen  atorvastatin (LIPITOR) 80 MG tablet Take 1 tablet (80 mg total) by mouth daily at 6 PM. 30 tablet 1  . cetirizine (ZYRTEC) 10 MG tablet Take 10 mg by mouth daily.    . Cholecalciferol (VITAMIN D-3) 5000 units TABS Take 1,500 Units by mouth daily.    Marland Kitchen lisinopril (PRINIVIL,ZESTRIL) 2.5 MG tablet Take 1 tablet (2.5 mg total) by mouth daily. 30 tablet 1  . metoprolol tartrate (LOPRESSOR) 25 MG tablet Take 0.5 tablets (12.5 mg total) by mouth 2 (two) times daily. 30 tablet 1  . Multiple Vitamin (MULTIVITAMIN WITH MINERALS) TABS tablet Take 1 tablet by mouth daily.     No current facility-administered medications for this visit.        Physical Exam: BP 107/68 (BP Location: Right Arm, Patient Position: Sitting, Cuff Size: Large)   Resp 16   Ht 6\' 2"  (1.88 m)   Wt 205 lb (93 kg)   SpO2 95% Comment: ON RA  BMI 26.32 kg/m   General appearance: alert and cooperative Neurologic: intact Heart: regular rate and rhythm, S1, S2 normal, no murmur, click, rub or gallop Lungs: clear to auscultation bilaterally Abdomen: soft, non-tender; bowel sounds normal; no masses,  no organomegaly Extremities: extremities normal, atraumatic, no cyanosis or edema and Homans sign is negative, no sign of DVT Wound: Sternum is stable and well healed   Diagnostic Studies & Laboratory data:  Recent Radiology Findings:   Dg Chest 2 View  Result Date: 02/14/2017 CLINICAL DATA:  CABG with cardioversion EXAM: CHEST  2 VIEW COMPARISON:  01/15/2017 FINDINGS: Sternotomy wires overlie normal cardiac silhouette. No appreciable pneumothorax. No pleural fluid or edema. IMPRESSION: No complicating features.  No pneumothorax. Electronically Signed   By: Genevive BiStewart  Edmunds M.D.   On: 02/14/2017 15:01      Recent Lab Findings: Lab Results  Component Value Date   WBC 8.7 01/29/2017   HGB 10.7 (L) 01/14/2017   HCT 36.8 (L) 01/29/2017   PLT 462 (H) 01/29/2017   GLUCOSE 102 (H) 01/29/2017   ALT 25 01/10/2017   AST 27  01/10/2017   NA 135 01/29/2017   K 4.6 01/29/2017   CL 96 01/29/2017   CREATININE 0.87 01/29/2017   BUN 19 01/29/2017   CO2 23 01/29/2017   INR 1.39 01/11/2017   HGBA1C 5.3 01/10/2017      Assessment / Plan:      Patient stable following recent coronary artery bypass grafting, making good postoperative recovery, continues on beta blocker aspirin and statin and ACE inhibitor. He's to start cardiac rehabilitation next week He is to avoid heavy lifting over 25 pounds 3 months Plan to see him back at his or Dr. Katrinka BlazingSmith requested anytime.    Delight OvensEdward B Natali Lavallee MD      [address] [place] [id number] Office [phone]   Beeper [phone]  02/14/2017 3:53 PM

## 2017-02-19 ENCOUNTER — Encounter (HOSPITAL_COMMUNITY)
Admission: RE | Admit: 2017-02-19 | Discharge: 2017-02-19 | Disposition: A | Discharge status: Home / Self Care | Payer: Medicare Other | Source: Ambulatory Visit | Attending: Cardiology | Admitting: Cardiology

## 2017-02-19 ENCOUNTER — Encounter (HOSPITAL_COMMUNITY): Payer: Self-pay

## 2017-02-19 VITALS — BP 122/81 | HR 87 | Ht 74.0 in | Wt 212.3 lb

## 2017-02-19 DIAGNOSIS — Z951 Presence of aortocoronary bypass graft: Secondary | ICD-10-CM

## 2017-02-19 NOTE — Progress Notes (Signed)
Cardiac Rehab Medication Review by a Pharmacist  Does the patient  feel that his/her medications are working for him/her?  yes  Has the patient been experiencing any side effects to the medications prescribed?  no  Does the patient measure his/her own blood pressure or blood glucose at home?  yes   Does the patient have any problems obtaining medications due to transportation or finances?   no  Understanding of regimen: good Understanding of indications: good Potential of compliance: good    Pharmacist comments: [name] is a 66 yo gentleman who presents to cardiac rehab without assistance. He lives in [place] where he left his blood pressure cuff. He will resume home checks when he returns home. He has a good understanding of his regimen.    [name], PharmD PGY1 Pharmacy Resident Pager: [phone] 02/19/2017 9:20 AM

## 2017-02-19 NOTE — Progress Notes (Signed)
Cardiac Individual Treatment Plan  Patient Details  Name: [name] MRN: [id number] Date of Birth: 1951/03/15 Referring Provider:     CARDIAC REHAB PHASE II ORIENTATION from 02/19/2017 in [facility] CARDIAC REHAB  Referring Provider  Donato Schultz MD      Initial Encounter Date:    CARDIAC REHAB PHASE II ORIENTATION from 02/19/2017 in [facility] CARDIAC REHAB  Date  02/19/17  Referring Provider  Donato Schultz MD      Visit Diagnosis: S/P CABG (coronary artery bypass graft)  Patient's Home Medications on Admission:  Current Outpatient Prescriptions:  .  acetaminophen (TYLENOL) 325 MG tablet, Take 2 tablets (650 mg total) by mouth every 6 (six) hours as needed for mild pain., Disp: , Rfl:  .  aspirin EC 81 MG tablet, Take 81 mg by mouth daily., Disp: , Rfl:  .  cetirizine (ZYRTEC) 10 MG tablet, Take 10 mg by mouth daily., Disp: , Rfl:  .  Cholecalciferol (VITAMIN D-3) 5000 units TABS, Take 15,000 Units by mouth daily. , Disp: , Rfl:  .  lisinopril (PRINIVIL,ZESTRIL) 2.5 MG tablet, Take 1 tablet (2.5 mg total) by mouth daily., Disp: 30 tablet, Rfl: 1 .  metoprolol tartrate (LOPRESSOR) 25 MG tablet, Take 0.5 tablets (12.5 mg total) by mouth 2 (two) times daily., Disp: 30 tablet, Rfl: 1 .  Multiple Vitamin (MULTIVITAMIN WITH MINERALS) TABS tablet, Take 1 tablet by mouth daily., Disp: , Rfl:  .  Omega-3 Fatty Acids (HM FISH OIL PO), Take 800 mg by mouth daily., Disp: , Rfl:  .  atorvastatin (LIPITOR) 80 MG tablet, Take 1 tablet (80 mg total) by mouth daily at 6 PM., Disp: 30 tablet, Rfl: 1  Past Medical History: Past Medical History:  Diagnosis Date  . Chest pain in adult 01/09/2017  . Coronary artery disease   . 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 Never Smoker  Smokeless Tobacco  . Never Used    Labs: Recent Review Flowsheet Data    Labs for ITP Cardiac and Pulmonary Rehab  Latest Ref Rng & Units 01/11/2017 01/11/2017 01/11/2017 01/11/2017 01/12/2017   Hemoglobin A1c 4.8 - 5.6 % - - - - -   PHART 7.350 - 7.450 7.341(L) 7.486(H) 7.329(L) - -   PCO2ART 32.0 - 48.0 mmHg 42.7 26.9(L) 42.9 - -   HCO3 20.0 - 28.0 mmol/L 23.2 20.3 22.6 - -   TCO2 0 - 100 mmol/L ACIDBASEDEF 0.0 - 2.0 mmol/L 3.0(H) 2.0 3.0(H) - -   O2SAT % 95.0 97.0 97.0 - -      Capillary Blood Glucose: Lab Results  Component Value Date   GLUCAP 126 (H) 01/15/2017   GLUCAP 91 01/15/2017   GLUCAP 116 (H) 01/14/2017   GLUCAP 102 (H) 01/14/2017   GLUCAP 121 (H) 01/14/2017     Exercise Target Goals: Date: 02/19/17  Exercise Program Goal: Individual exercise prescription set with THRR, safety & activity barriers. Participant demonstrates ability to understand and report RPE using BORG scale, to self-measure pulse accurately, and to acknowledge the importance of the exercise prescription.  Exercise Prescription Goal: Starting with aerobic activity 30 plus minutes a day, 3 days per week for initial exercise prescription. Provide home exercise prescription and guidelines that participant acknowledges understanding prior to discharge.  Activity Barriers & Risk Stratification:     Activity Barriers & Cardiac Risk Stratification -  02/19/17 1205      Activity Barriers & Cardiac Risk Stratification   Activity Barriers Back Problems;Other (comment)   Comments Right knee scope x2, left knee scope x1. History of low back pain.   Cardiac Risk Stratification High      6 Minute Walk:     6 Minute Walk    Row Name 02/19/17 1203         6 Minute Walk   Phase Initial     Distance 1838 feet     Walk Time 6 minutes     # of Rest Breaks 0     MPH 3.48     METS 4.16     RPE 11     VO2 Peak 14.57     Symptoms Yes (comment)     Comments Participant c/o left knee soreness at end of walk test.     Resting HR 87 bpm     Resting BP 122/81     Max Ex. HR 112 bpm     Max Ex. BP 128/76      2 Minute Post BP 102/60        Oxygen Initial Assessment:   Oxygen Re-Evaluation:   Oxygen Discharge (Final Oxygen Re-Evaluation):   Initial Exercise Prescription:     Initial Exercise Prescription - 02/19/17 1200      Date of Initial Exercise RX and Referring Provider   Date 02/19/17   Referring Provider Donato Schultz MD     Treadmill   MPH 3   Grade 1   Minutes 10   METs 3.71     Bike   Level 1   Minutes 10   METs 2.99     NuStep   Level 3   SPM 90   Minutes 10   METs 2.8     Prescription Details   Frequency (times per week) 3   Duration Progress to 30 minutes of continuous aerobic without signs/symptoms of physical distress     Intensity   THRR 40-80% of Max Heartrate 61-122   Ratings of Perceived Exertion 11-13   Perceived Dyspnea 0-4     Progression   Progression Continue to progress workloads to maintain intensity without signs/symptoms of physical distress.     Resistance Training   Training Prescription Yes   Weight 3lbs   Reps 10-15      Perform Capillary Blood Glucose checks as needed.  Exercise Prescription Changes:   Exercise Comments:   Exercise Goals and Review:     Exercise Goals    Row Name 02/19/17 1213 02/19/17 1214           Exercise Goals   Increase Physical Activity Yes -  increase stamina/energy levels      Intervention Provide advice, education, support and counseling about physical activity/exercise needs.;Develop an individualized exercise prescription for aerobic and resistive training based on initial evaluation findings, risk stratification, comorbidities and participant's personal goals.  -      Expected Outcomes Achievement of increased cardiorespiratory fitness and enhanced flexibility, muscular endurance and strength shown through measurements of functional capacity and personal statement of participant.  -      Increase Strength and Stamina Yes -  get active- be able to fish/hike ~532ft-10miles       Intervention Provide advice, education, support and counseling about physical activity/exercise needs.;Develop an individualized exercise prescription for aerobic and resistive training based on initial evaluation findings, risk stratification, comorbidities and participant's personal goals.  -  Expected Outcomes Achievement of increased cardiorespiratory fitness and enhanced flexibility, muscular endurance and strength shown through measurements of functional capacity and personal statement of participant.  -         Exercise Goals Re-Evaluation :    Discharge Exercise Prescription (Final Exercise Prescription Changes):   Nutrition:  Target Goals: Understanding of nutrition guidelines, daily intake of sodium 1500mg , cholesterol 200mg , calories 30% from fat and 7% or less from saturated fats, daily to have 5 or more servings of fruits and vegetables.  Biometrics:     Pre Biometrics - 02/19/17 1207      Pre Biometrics   Height 6\' 2"  (1.88 m)   Weight 212 lb 4.9 oz (96.3 kg)   Waist Circumference 36.5 inches   Hip Circumference 41.5 inches   Waist to Hip Ratio 0.88 %   BMI (Calculated) 27.3   Triceps Skinfold 18 mm   % Body Fat 25.9 %   Grip Strength 47 kg   Flexibility 0 in   Single Leg Stand 30 seconds       Nutrition Therapy Plan and Nutrition Goals:   Nutrition Discharge: Nutrition Scores:   Nutrition Goals Re-Evaluation:   Nutrition Goals Re-Evaluation:   Nutrition Goals Discharge (Final Nutrition Goals Re-Evaluation):   Psychosocial: Target Goals: Acknowledge presence or absence of significant depression and/or stress, maximize coping skills, provide positive support system. Participant is able to verbalize types and ability to use techniques and skills needed for reducing stress and depression.  Initial Review & Psychosocial Screening:     Initial Psych Review & Screening - 02/19/17 1503      Initial Review   Current issues with None Identified      Family Dynamics   Good Support System? Yes   Comments Brief psychosocial assessment reveals no issues or concerns      Barriers   Psychosocial barriers to participate in program There are no identifiable barriers or psychosocial needs.      Quality of Life Scores:     Quality of Life - 02/19/17 1134      Quality of Life Scores   Health/Function Pre 22.9 %   Socioeconomic Pre 22.86 %   Psych/Spiritual Pre 22.67 %   Family Pre 22.5 %   GLOBAL Pre 22.79 %      PHQ-9: Recent Review Flowsheet Data    There is no flowsheet data to display.     Interpretation of Total Score  Total Score Depression Severity:  1-4 = Minimal depression, 5-9 = Mild depression, 10-14 = Moderate depression, 15-19 = Moderately severe depression, 20-27 = Severe depression   Psychosocial Evaluation and Intervention:   Psychosocial Re-Evaluation:   Psychosocial Discharge (Final Psychosocial Re-Evaluation):   Vocational Rehabilitation: Provide vocational rehab assistance to qualifying candidates.   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 Participant will state understanding/return demonstration of topics presented.  Learning Barriers/Preferences:     Learning Barriers/Preferences - 02/19/17 1213      Learning Barriers/Preferences   Learning Barriers Hearing;Sight   Learning Preferences Video;Pictoral;Computer/Internet      Education Topics: Count Your Pulse:  -Group instruction provided by verbal instruction, demonstration, patient participation and written materials to  support subject.  Instructors address importance of being able to find your pulse and how  to count your pulse when at home without a heart monitor.  Patients get hands on experience counting their pulse with staff help and individually.   Heart Attack, Angina, and Risk Factor Modification:  -Group instruction provided by verbal instruction, video, and written materials to support subject.  Instructors address signs and symptoms of angina and heart attacks.    Also discuss risk factors for heart disease and how to make changes to improve heart health risk factors.   Functional Fitness:  -Group instruction provided by verbal instruction, demonstration, patient participation, and written materials to support subject.  Instructors address safety measures for doing things around the house.  Discuss how to get up and down off the floor, how to pick things up properly, how to safely get out of a chair without assistance, and balance training.   Meditation and Mindfulness:  -Group instruction provided by verbal instruction, patient participation, and written materials to support subject.  Instructor addresses importance of mindfulness and meditation practice to help reduce stress and improve awareness.  Instructor also leads participants through a meditation exercise.    Stretching for Flexibility and Mobility:  -Group instruction provided by verbal instruction, patient participation, and written materials to support subject.  Instructors lead participants through series of stretches that are designed to increase flexibility thus improving mobility.  These stretches are additional exercise for major muscle groups that are typically performed during regular warm up and cool down.   Hands Only CPR Anytime:  -Group instruction provided by verbal instruction, video, patient participation and written materials to support subject.  Instructors co-teach with AHA video for hands only CPR.  Participants get hands on experience with mannequins.   Nutrition I class: Heart Healthy Eating:  -Group  instruction provided by PowerPoint slides, verbal discussion, and written materials to support subject matter. The instructor gives an explanation and review of the Therapeutic Lifestyle Changes diet recommendations, which includes a discussion on lipid goals, dietary fat, sodium, fiber, plant stanol/sterol esters, sugar, and the components of a well-balanced, healthy diet.   Nutrition II class: Lifestyle Skills:  -Group instruction provided by PowerPoint slides, verbal discussion, and written materials to support subject matter. The instructor gives an explanation and review of label reading, grocery shopping for heart health, heart healthy recipe modifications, and ways to make healthier choices when eating out.   Diabetes Question & Answer:  -Group instruction provided by PowerPoint slides, verbal discussion, and written materials to support subject matter. The instructor gives an explanation and review of diabetes co-morbidities, pre- and post-prandial blood glucose goals, pre-exercise blood glucose goals, signs, symptoms, and treatment of hypoglycemia and hyperglycemia, and foot care basics.   Diabetes Blitz:  -Group instruction provided by PowerPoint slides, verbal discussion, and written materials to support subject matter. The instructor gives an explanation and review of the physiology behind type 1 and type 2 diabetes, diabetes medications and rational behind using different medications, pre- and post-prandial blood glucose recommendations and Hemoglobin A1c goals, diabetes diet, and exercise including blood glucose guidelines for exercising safely.    Portion Distortion:  -Group instruction provided by PowerPoint slides, verbal discussion, written materials, and food models to support subject matter. The instructor gives an explanation of serving size versus portion size, changes in portions sizes over the last 20 years, and what consists of a serving from each food group.   Stress  Management:  -Group instruction provided by verbal  instruction, video, and written materials to support subject matter.  Instructors review role of stress in heart disease and how to cope with stress positively.     Exercising on Your Own:  -Group instruction provided by verbal instruction, power point, and written materials to support subject.  Instructors discuss benefits of exercise, components of exercise, frequency and intensity of exercise, and end points for exercise.  Also discuss use of nitroglycerin and activating EMS.  Review options of places to exercise outside of rehab.  Review guidelines for sex with heart disease.   Cardiac Drugs I:  -Group instruction provided by verbal instruction and written materials to support subject.  Instructor reviews cardiac drug classes: antiplatelets, anticoagulants, beta blockers, and statins.  Instructor discusses reasons, side effects, and lifestyle considerations for each drug class.   Cardiac Drugs II:  -Group instruction provided by verbal instruction and written materials to support subject.  Instructor reviews cardiac drug classes: angiotensin converting enzyme inhibitors (ACE-I), angiotensin II receptor blockers (ARBs), nitrates, and calcium channel blockers.  Instructor discusses reasons, side effects, and lifestyle considerations for each drug class.   Anatomy and Physiology of the Circulatory System:  -Group instruction provided by verbal instruction, video, and written materials to support subject.  Reviews functional anatomy of heart, how it relates to various diagnoses, and what role the heart plays in the overall system.   Knowledge Questionnaire Score:     Knowledge Questionnaire Score - 02/19/17 1134      Knowledge Questionnaire Score   Pre Score 22/24      Core Components/Risk Factors/Patient Goals at Admission:     Personal Goals and Risk Factors at Admission - 02/19/17 1214      Core Components/Risk Factors/Patient  Goals on Admission   Hypertension Yes   Intervention Provide education on lifestyle modifcations including regular physical activity/exercise, weight management, moderate sodium restriction and increased consumption of fresh fruit, vegetables, and low fat dairy, alcohol moderation, and smoking cessation.;Monitor prescription use compliance.   Expected Outcomes Short Term: Continued assessment and intervention until BP is < 140/59mm HG in hypertensive participants. < 130/56mm HG in hypertensive participants with diabetes, heart failure or chronic kidney disease.;Long Term: Maintenance of blood pressure at goal levels.   Lipids Yes   Intervention Provide education and support for participant on nutrition & aerobic/resistive exercise along with prescribed medications to achieve LDL 70mg , HDL >40mg .   Expected Outcomes Short Term: Participant states understanding of desired cholesterol values and is compliant with medications prescribed. Participant is following exercise prescription and nutrition guidelines.;Long Term: Cholesterol controlled with medications as prescribed, with individualized exercise RX and with personalized nutrition plan. Value goals: LDL < , HDL > 40 mg.      Core Components/Risk Factors/Patient Goals Review:    Core Components/Risk Factors/Patient Goals at Discharge (Final Review):    ITP Comments:     ITP Comments    Row Name 02/19/17 1006           ITP Comments Dr. Armanda Magic, Medical Director           Comments: Pt in today for cardiac rehab orientation appt from 0800 to 1015.  As a part of the orientation appt pt completed 5 minutes of warm up stretches and 6 minute walk test.  Pt tolerated well with no complaints of cp or sob. Monitor showed SR with occ PVC noted. Brief psychosocial assessment reveals no immediate barriers to participating in cardiac rehab. Pt lists his ex wife, daughters and friends as a  support system.  Pt is looking forward to full  exercise next week. Alanson Aly, BSN Cardiac and Emergency planning/management officer

## 2017-02-25 ENCOUNTER — Encounter (HOSPITAL_COMMUNITY)
Admission: RE | Admit: 2017-02-25 | Discharge: 2017-02-25 | Disposition: A | Discharge status: Home / Self Care | Payer: Medicare Other | Source: Ambulatory Visit | Attending: Cardiology | Admitting: Cardiology

## 2017-02-25 DIAGNOSIS — Z951 Presence of aortocoronary bypass graft: Secondary | ICD-10-CM

## 2017-02-25 NOTE — Progress Notes (Signed)
Daily Session Note  Patient Details  Name: [name] MRN: [id number] Date of Birth: 1950-12-19 Referring Provider:     [place] from 02/19/2017 in [place]  Referring Provider  Candee Furbish MD      Encounter Date: 02/25/2017  Check In:     Session Check In - 02/25/17 1132      Check-In   Location [facility]   Staff Present Maurice Small, RN, BSN;Amber Fair, MS, ACSM RCEP, Exercise Physiologist;Joann Rion, RN, BSN   Supervising physician immediately available to respond to emergencies [facility] immediately available   Physician(s) Dr. Ree Kida    Medication changes reported     No   Fall or balance concerns reported    No   Tobacco Cessation No Change   Warm-up and Cool-down Performed as group-led instruction   Resistance Training Performed Yes   VAD Patient? No     Pain Assessment   Currently in Pain? No/denies      Capillary Blood Glucose: No results found for this or any previous visit (from the past 24 hour(s)).    History  Smoking Status  . Never Smoker  Smokeless Tobacco  . Never Used    Goals Met:  Exercise tolerated well Personal goals reviewed No report of cardiac concerns or symptoms  Goals Unmet:  Not Applicable  Comments:  Pt started full exercise cardiac rehab today.  Pt tolerated light exercise without difficulty. VSS, telemetry-Sr , asymptomatic.  Medication list reconciled. Pt denies barriers to medicaiton compliance.  PSYCHOSOCIAL ASSESSMENT:  PHQ-0. Pt exhibits positive coping skills, hopeful outlook with supportive family. Pt completed pre assessment Quality of Life survey.  Pt scored the following     Quality of Life - 02/19/17 1134      Quality of Life Scores   Health/Function Pre 22.9 %   Socioeconomic Pre 22.86 %   Psych/Spiritual Pre 22.67 %   Family Pre 22.5 %   GLOBAL Pre 22.79 %     No psychosocial needs identified at this time,  no psychosocial interventions necessary.    Pt enjoys fishing of all types. Pt desires to increase energy levels, get back to previous activities without symptoms of angina.  Pt long term goal is get active with hiking fishing and be able to hike between 500 ft to 10 miles in order to keep up with fishing buddies.   Pt oriented to exercise equipment and routine.  Understanding verbalized.  Maurice Small RN, BSN Cardiac and Pulmonary Rehab Nurse Navigator     Dr. Fransico Him is Medical Director for Cardiac Rehab at [facility].

## 2017-02-26 ENCOUNTER — Ambulatory Visit (HOSPITAL_COMMUNITY): Payer: Medicare Other

## 2017-02-26 ENCOUNTER — Encounter: Payer: Self-pay | Admitting: *Deleted

## 2017-02-27 ENCOUNTER — Encounter (HOSPITAL_COMMUNITY)
Admission: RE | Admit: 2017-02-27 | Discharge: 2017-02-27 | Disposition: A | Discharge status: Home / Self Care | Payer: Medicare Other | Source: Ambulatory Visit | Attending: Cardiology | Admitting: Cardiology

## 2017-02-27 ENCOUNTER — Encounter (HOSPITAL_COMMUNITY): Payer: Medicare Other

## 2017-02-27 DIAGNOSIS — Z951 Presence of aortocoronary bypass graft: Secondary | ICD-10-CM

## 2017-03-01 ENCOUNTER — Encounter (HOSPITAL_COMMUNITY)
Admission: RE | Admit: 2017-03-01 | Discharge: 2017-03-01 | Disposition: A | Discharge status: Home / Self Care | Payer: Medicare Other | Source: Ambulatory Visit | Attending: Cardiology | Admitting: Cardiology

## 2017-03-01 ENCOUNTER — Ambulatory Visit: Payer: Medicare Other | Admitting: Cardiology

## 2017-03-01 ENCOUNTER — Encounter (HOSPITAL_COMMUNITY): Payer: Medicare Other

## 2017-03-01 DIAGNOSIS — Z951 Presence of aortocoronary bypass graft: Secondary | ICD-10-CM

## 2017-03-04 ENCOUNTER — Encounter (HOSPITAL_COMMUNITY)
Admission: RE | Admit: 2017-03-04 | Discharge: 2017-03-04 | Disposition: A | Discharge status: Home / Self Care | Payer: Medicare Other | Source: Ambulatory Visit | Attending: Cardiology | Admitting: Cardiology

## 2017-03-04 DIAGNOSIS — Z951 Presence of aortocoronary bypass graft: Secondary | ICD-10-CM

## 2017-03-04 NOTE — Progress Notes (Signed)
Home exercise guidelines reviewed by Nonah Mattes, Academic Intern with patient including endpoints, temperature precautions, target heart rate and rate of perceived exertion. Pt is walking as his mode of home exercise. Pt voices understanding of instructions given. Artist Pais, MS, ACSM CEP

## 2017-03-06 ENCOUNTER — Encounter (HOSPITAL_COMMUNITY)
Admission: RE | Admit: 2017-03-06 | Discharge: 2017-03-06 | Disposition: A | Discharge status: Home / Self Care | Payer: Medicare Other | Source: Ambulatory Visit | Attending: Cardiology | Admitting: Cardiology

## 2017-03-06 DIAGNOSIS — Z951 Presence of aortocoronary bypass graft: Secondary | ICD-10-CM | POA: Diagnosis not present

## 2017-03-06 NOTE — Progress Notes (Signed)
[name] 66 y.o. male Nutrition Note Spoke with pt. Nutrition Survey reviewed with pt. Pt is following Step 1 of the Therapeutic Lifestyle Changes diet. Pt states he has lost 18.5 lb from his pre-op wt of 225 lb. Pt has lost  4.2 lb since starting Cardiac Rehab 2 weeks ago. Pt is working toward losing wt by changing his diet and getting back to his active lifestyle. Pt feels his wt increased over the past 1.5 years due to decreased activity level over that time period. Pt expressed understanding of the information reviewed. Pt aware of nutrition education classes offered and plans on attending nutrition classes. Lab Results  Component Value Date   HGBA1C 5.3 01/10/2017   Wt Readings from Last 3 Encounters:  02/19/17 212 lb 4.9 oz (96.3 kg)  02/14/17 205 lb (93 kg)  01/29/17 205 lb 6.4 oz (93.2 kg)   Nutrition Diagnosis ? Food-and nutrition-related knowledge deficit related to lack of exposure to information as related to diagnosis of: ? CVD ? Overweight related to excessive energy intake as evidenced by a BMI of 27.3 Nutrition Intervention ? Benefits of adopting Therapeutic Lifestyle Changes discussed when Medficts reviewed. ? Pt to attend the Portion Distortion class ? Pt to attend the  ? Nutrition I class                     ? Nutrition II class ? Continue client-centered nutrition education by RD, as part of interdisciplinary care.  Goal(s) ? Pt to identify and limit food sources of saturated fat, trans fat, and sodium ? Pt to identify food quantities necessary to achieve weight loss of 6-15 lb at graduation from cardiac rehab.   Monitor and Evaluate progress toward nutrition goal with team.  Mickle Plumb, M.Ed, RD, LDN, CDE 03/06/2017 12:14 PM

## 2017-03-08 ENCOUNTER — Encounter (HOSPITAL_COMMUNITY)
Admission: RE | Admit: 2017-03-08 | Discharge: 2017-03-08 | Disposition: A | Discharge status: Home / Self Care | Payer: Medicare Other | Source: Ambulatory Visit | Attending: Cardiology | Admitting: Cardiology

## 2017-03-08 ENCOUNTER — Other Ambulatory Visit: Payer: Self-pay | Admitting: Cardiology

## 2017-03-08 DIAGNOSIS — Z951 Presence of aortocoronary bypass graft: Secondary | ICD-10-CM | POA: Diagnosis not present

## 2017-03-11 ENCOUNTER — Encounter (HOSPITAL_COMMUNITY)
Admission: RE | Admit: 2017-03-11 | Discharge: 2017-03-11 | Disposition: A | Discharge status: Home / Self Care | Payer: Medicare Other | Source: Ambulatory Visit | Attending: Cardiology | Admitting: Cardiology

## 2017-03-11 DIAGNOSIS — Z951 Presence of aortocoronary bypass graft: Secondary | ICD-10-CM | POA: Diagnosis not present

## 2017-03-13 ENCOUNTER — Encounter (HOSPITAL_COMMUNITY)
Admission: RE | Admit: 2017-03-13 | Discharge: 2017-03-13 | Disposition: A | Discharge status: Home / Self Care | Payer: Medicare Other | Source: Ambulatory Visit | Attending: Cardiology | Admitting: Cardiology

## 2017-03-13 DIAGNOSIS — Z951 Presence of aortocoronary bypass graft: Secondary | ICD-10-CM | POA: Diagnosis not present

## 2017-03-13 NOTE — Progress Notes (Signed)
Cardiac Individual Treatment Plan  Patient Details  Name: [name] MRN: [id number] Date of Birth: 09-24-1951 Referring Provider:     CARDIAC REHAB PHASE II ORIENTATION from 02/19/2017 in [facility]  Referring Provider  Candee Furbish MD      Initial Encounter Date:    [place] from 02/19/2017 in [facility]  Date  02/19/17  Referring Provider  Candee Furbish MD      Visit Diagnosis: S/P CABG (coronary artery bypass graft)  Patient's Home Medications on Admission:  Current Outpatient Prescriptions:  .  acetaminophen (TYLENOL) 325 MG tablet, Take 2 tablets (650 mg total) by mouth every 6 (six) hours as needed for mild pain. (Patient not taking: Reported on 02/25/2017), Disp: , Rfl:  .  aspirin EC 81 MG tablet, Take 81 mg by mouth daily., Disp: , Rfl:  .  atorvastatin (LIPITOR) 80 MG tablet, Take 1 tablet (80 mg total) by mouth daily at 6 PM., Disp: 30 tablet, Rfl: 1 .  cetirizine (ZYRTEC) 10 MG tablet, Take 10 mg by mouth daily., Disp: , Rfl:  .  Cholecalciferol (VITAMIN D-3) 5000 units TABS, Take 15,000 Units by mouth daily. , Disp: , Rfl:  .  lisinopril (PRINIVIL,ZESTRIL) 2.5 MG tablet, TAKE 1 TABLET BY MOUTH EVERY DAY, Disp: 30 tablet, Rfl: 10 .  metoprolol tartrate (LOPRESSOR) 25 MG tablet, Take 0.5 tablets (12.5 mg total) by mouth 2 (two) times daily., Disp: 30 tablet, Rfl: 1 .  Multiple Vitamin (MULTIVITAMIN WITH MINERALS) TABS tablet, Take 1 tablet by mouth daily., Disp: , Rfl:  .  Omega-3 Fatty Acids (HM FISH OIL PO), Take 800 mg by mouth daily., Disp: , Rfl:   Past Medical History: Past Medical History:  Diagnosis Date  . Chest pain in adult 01/09/2017  . Coronary artery disease   . 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 Never Smoker  Smokeless Tobacco  . Never Used    Labs: Recent Review Flowsheet Data    Labs for  ITP Cardiac and Pulmonary Rehab Latest Ref Rng & Units 01/11/2017 01/11/2017 01/11/2017 01/11/2017 01/12/2017   Hemoglobin A1c 4.8 - 5.6 % - - - - -   PHART 7.350 - 7.450 7.341(L) 7.486(H) 7.329(L) - -   PCO2ART 32.0 - 48.0 mmHg 42.7 26.9(L) 42.9 - -   HCO3 20.0 - 28.0 mmol/L 23.2 20.3 22.6 - -   TCO2 0 - 100 mmol/L _0 ACIDBASEDEF 0.0 - 2.0 mmol/L 3.0(H) 2.0 3.0(H) - -   O2SAT % 95.0 97.0 97.0 - -      Capillary Blood Glucose: Lab Results  Component Value Date   GLUCAP 126 (H) 01/15/2017   GLUCAP 91 01/15/2017   GLUCAP 116 (H) 01/14/2017   GLUCAP 102 (H) 01/14/2017   GLUCAP 121 (H) 01/14/2017     Exercise Target Goals:    Exercise Program Goal: Individual exercise prescription set with THRR, safety & activity barriers. Participant demonstrates ability to understand and report RPE using BORG scale, to self-measure pulse accurately, and to acknowledge the importance of the exercise prescription.  Exercise Prescription Goal: Starting with aerobic activity 30 plus minutes a day, 3 days per week for initial exercise prescription. Provide home exercise prescription and guidelines that participant acknowledges understanding prior to discharge.  Activity Barriers & Risk Stratification:     Activity Barriers &  Cardiac Risk Stratification - 02/19/17 1205      Activity Barriers & Cardiac Risk Stratification   Activity Barriers Back Problems;Other (comment)   Comments Right knee scope x2, left knee scope x1. History of low back pain.   Cardiac Risk Stratification High      6 Minute Walk:     6 Minute Walk    Row Name 02/19/17 1203         6 Minute Walk   Phase Initial     Distance 1838 feet     Walk Time 6 minutes     # of Rest Breaks 0     MPH 3.48     METS 4.16     RPE 11     VO2 Peak 14.57     Symptoms Yes (comment)     Comments Participant c/o left knee soreness at end of walk test.     Resting HR 87 bpm     Resting BP 122/81     Max Ex. HR 112 bpm      Max Ex. BP 128/76     2 Minute Post BP 102/60        Oxygen Initial Assessment:   Oxygen Re-Evaluation:   Oxygen Discharge (Final Oxygen Re-Evaluation):   Initial Exercise Prescription:     Initial Exercise Prescription - 02/19/17 1200      Date of Initial Exercise RX and Referring Provider   Date 02/19/17   Referring Provider Candee Furbish MD     Treadmill   MPH 3   Grade 1   Minutes 10   METs 3.71     Bike   Level 1   Minutes 10   METs 2.99     NuStep   Level 3   SPM 90   Minutes 10   METs 2.8     Prescription Details   Frequency (times per week) 3   Duration Progress to 30 minutes of continuous aerobic without signs/symptoms of physical distress     Intensity   THRR 40-80% of Max Heartrate 61-122   Ratings of Perceived Exertion 11-13   Perceived Dyspnea 0-4     Progression   Progression Continue to progress workloads to maintain intensity without signs/symptoms of physical distress.     Resistance Training   Training Prescription Yes   Weight 3lbs   Reps 10-15      Perform Capillary Blood Glucose checks as needed.  Exercise Prescription Changes:     Exercise Prescription Changes    Row Name 03/11/17 1700             Response to Exercise   Blood Pressure (Admit) 110/80       Blood Pressure (Exercise) 124/80       Blood Pressure (Exit) 104/68       Heart Rate (Admit) 85 bpm       Heart Rate (Exercise) 120 bpm       Heart Rate (Exit) 91 bpm       Rating of Perceived Exertion (Exercise) 111       Symptoms none       Duration Continue with 30 min of aerobic exercise without signs/symptoms of physical distress.       Intensity THRR unchanged         Progression   Progression Continue to progress workloads to maintain intensity without signs/symptoms of physical distress.       Average METs 3  Resistance Training   Training Prescription Yes       Weight 4lbs       Reps 10-15       Time 10 Minutes         Treadmill    MPH 3       Grade 1       Minutes 10       METs 3.71         Bike   Level 1       Minutes 10       METs 3         NuStep   Level 3       SPM 90       Minutes 10       METs 2.3          Exercise Comments:     Exercise Comments    Row Name 03/13/17 1015           Exercise Comments Reviewed MET's and goals. Pt is tolerating exercise well; will continue to monitor pt's activity levels and exercise progression          Exercise Goals and Review:     Exercise Goals    Row Name 02/19/17 1213 02/19/17 1214           Exercise Goals   Increase Physical Activity Yes -  increase stamina/energy levels      Intervention Provide advice, education, support and counseling about physical activity/exercise needs.;Develop an individualized exercise prescription for aerobic and resistive training based on initial evaluation findings, risk stratification, comorbidities and participant's personal goals.  -      Expected Outcomes Achievement of increased cardiorespiratory fitness and enhanced flexibility, muscular endurance and strength shown through measurements of functional capacity and personal statement of participant.  -      Increase Strength and Stamina Yes -  get active- be able to fish/hike ~534ft-10miles      Intervention Provide advice, education, support and counseling about physical activity/exercise needs.;Develop an individualized exercise prescription for aerobic and resistive training based on initial evaluation findings, risk stratification, comorbidities and participant's personal goals.  -      Expected Outcomes Achievement of increased cardiorespiratory fitness and enhanced flexibility, muscular endurance and strength shown through measurements of functional capacity and personal statement of participant.  -         Exercise Goals Re-Evaluation :     Exercise Goals Re-Evaluation    Row Name 03/11/17 1707 03/13/17 1019           Exercise Goal Re-Evaluation    Exercise Goals Review Increase Physical Activity;Increase Strenth and Stamina  -      Comments Reviewed HEP on 4/16 with student intern. Pt mode of exercise is walking 2x/week in addition coming to cardiac rehab. Discussed activity progression, warning signs and weather precautions. Pt verbalize understanding. Pt stated " able to walk inclines and negogiate stairs better without fatigue and SOB"       Expected Outcomes Pt will be compliant with HEP and will increase in aerobic capacity and exercise tolerance Pt will be compliant with HEP and will increase in aerobic capacity and exercise tolerance          Discharge Exercise Prescription (Final Exercise Prescription Changes):     Exercise Prescription Changes - 03/11/17 1700      Response to Exercise   Blood Pressure (Admit) 110/80   Blood Pressure (Exercise) 124/80   Blood Pressure (Exit) 104/68  Heart Rate (Admit) 85 bpm   Heart Rate (Exercise) 120 bpm   Heart Rate (Exit) 91 bpm   Rating of Perceived Exertion (Exercise) 111   Symptoms none   Duration Continue with 30 min of aerobic exercise without signs/symptoms of physical distress.   Intensity THRR unchanged     Progression   Progression Continue to progress workloads to maintain intensity without signs/symptoms of physical distress.   Average METs 3     Resistance Training   Training Prescription Yes   Weight 4lbs   Reps 10-15   Time 10 Minutes     Treadmill   MPH 3   Grade 1   Minutes 10   METs 3.71     Bike   Level 1   Minutes 10   METs 3     NuStep   Level 3   SPM 90   Minutes 10   METs 2.3      Nutrition:  Target Goals: Understanding of nutrition guidelines, daily intake of sodium '1500mg'$ , cholesterol '200mg'$ , calories 30% from fat and 7% or less from saturated fats, daily to have 5 or more servings of fruits and vegetables.  Biometrics:     Pre Biometrics - 02/19/17 1207      Pre Biometrics   Height '6\' 2"'$  (1.88 m)   Weight 212 lb 4.9 oz (96.3  kg)   Waist Circumference 36.5 inches   Hip Circumference 41.5 inches   Waist to Hip Ratio 0.88 %   BMI (Calculated) 27.3   Triceps Skinfold 18 mm   % Body Fat 25.9 %   Grip Strength 47 kg   Flexibility 0 in   Single Leg Stand 30 seconds       Nutrition Therapy Plan and Nutrition Goals:     Nutrition Therapy & Goals - 03/06/17 1218      Nutrition Therapy   Diet Therapeutic Lifestyle Changes     Personal Nutrition Goals   Nutrition Goal Wt loss of 1-2 lb/week to a wt loss goal of 6-15 lb at graduation from [employer], educate and counsel regarding individualized specific dietary modifications aiming towards targeted core components such as weight, hypertension, lipid management, diabetes, heart failure and other comorbidities.   Expected Outcomes Short Term Goal: Understand basic principles of dietary content, such as calories, fat, sodium, cholesterol and nutrients.;Long Term Goal: Adherence to prescribed nutrition plan.      Nutrition Discharge: Nutrition Scores:     Nutrition Assessments - 02/20/17 1410      MEDFICTS Scores   Pre Score 64      Nutrition Goals Re-Evaluation:   Nutrition Goals Re-Evaluation:   Nutrition Goals Discharge (Final Nutrition Goals Re-Evaluation):   Psychosocial: Target Goals: Acknowledge presence or absence of significant depression and/or stress, maximize coping skills, provide positive support system. Participant is able to verbalize types and ability to use techniques and skills needed for reducing stress and depression.  Initial Review & Psychosocial Screening:     Initial Psych Review & Screening - 02/19/17 1503      Initial Review   Current issues with None Identified     Family Dynamics   Good Support System? Yes   Comments Brief psychosocial assessment reveals no issues or concerns      Barriers   Psychosocial barriers to participate in program There are no  identifiable barriers or psychosocial needs.      Quality of Life Scores:  Quality of Life - 02/19/17 1134      Quality of Life Scores   Health/Function Pre 22.9 %   Socioeconomic Pre 22.86 %   Psych/Spiritual Pre 22.67 %   Family Pre 22.5 %   GLOBAL Pre 22.79 %      PHQ-9: Recent Review Flowsheet Data    Depression screen [facility] 2/9 02/25/2017   Decreased Interest 0   Down, Depressed, Hopeless 0   PHQ - 2 Score 0     Interpretation of Total Score  Total Score Depression Severity:  1-4 = Minimal depression, 5-9 = Mild depression, 10-14 = Moderate depression, 15-19 = Moderately severe depression, 20-27 = Severe depression   Psychosocial Evaluation and Intervention:     Psychosocial Evaluation - 03/13/17 1508      Psychosocial Evaluation & Interventions   Interventions Stress management education;Encouraged to exercise with the program and follow exercise prescription;Relaxation education   Comments pt is retired and loves bein able to fish when he would like to, good support    Continue Psychosocial Services  No Follow up required      Psychosocial Re-Evaluation:   Psychosocial Discharge (Final Psychosocial Re-Evaluation):   Vocational Rehabilitation: Provide vocational rehab assistance to qualifying candidates.   Vocational Rehab Evaluation & Intervention:     Vocational Rehab - 02/19/17 1503      Initial Vocational Rehab Evaluation & Intervention   Assessment shows need for Vocational Rehabilitation No  Retired from [employer]      Education: Education Goals: Education classes will be provided on a weekly basis, covering required topics. Participant will state understanding/return demonstration of topics presented.  Learning Barriers/Preferences:     Learning Barriers/Preferences - 02/19/17 1213      Learning Barriers/Preferences   Learning Barriers Hearing;Sight   Learning Preferences Video;Pictoral;Computer/Internet       Education Topics: Count Your Pulse:  -Group instruction provided by verbal instruction, demonstration, patient participation and written materials to support subject.  Instructors address importance of being able to find your pulse and how to count your pulse when at home without a heart monitor.  Patients get hands on experience counting their pulse with staff help and individually.   Heart Attack, Angina, and Risk Factor Modification:  -Group instruction provided by verbal instruction, video, and written materials to support subject.  Instructors address signs and symptoms of angina and heart attacks.    Also discuss risk factors for heart disease and how to make changes to improve heart health risk factors.   Functional Fitness:  -Group instruction provided by verbal instruction, demonstration, patient participation, and written materials to support subject.  Instructors address safety measures for doing things around the house.  Discuss how to get up and down off the floor, how to pick things up properly, how to safely get out of a chair without assistance, and balance training.   CARDIAC REHAB PHASE II EXERCISE from 03/08/2017 in [facility]  Date  03/08/17  Instruction Review Code  2- meets goals/outcomes      Meditation and Mindfulness:  -Group instruction provided by verbal instruction, patient participation, and written materials to support subject.  Instructor addresses importance of mindfulness and meditation practice to help reduce stress and improve awareness.  Instructor also leads participants through a meditation exercise.    Stretching for Flexibility and Mobility:  -Group instruction provided by verbal instruction, patient participation, and written materials to support subject.  Instructors lead participants through series of  stretches that are designed to increase flexibility thus improving mobility.  These stretches are additional  exercise for major muscle groups that are typically performed during regular warm up and cool down.   Hands Only CPR Anytime:  -Group instruction provided by verbal instruction, video, patient participation and written materials to support subject.  Instructors co-teach with AHA video for hands only CPR.  Participants get hands on experience with mannequins.   Nutrition I class: Heart Healthy Eating:  -Group instruction provided by PowerPoint slides, verbal discussion, and written materials to support subject matter. The instructor gives an explanation and review of the Therapeutic Lifestyle Changes diet recommendations, which includes a discussion on lipid goals, dietary fat, sodium, fiber, plant stanol/sterol esters, sugar, and the components of a well-balanced, healthy diet.   Nutrition II class: Lifestyle Skills:  -Group instruction provided by PowerPoint slides, verbal discussion, and written materials to support subject matter. The instructor gives an explanation and review of label reading, grocery shopping for heart health, heart healthy recipe modifications, and ways to make healthier choices when eating out.   Diabetes Question & Answer:  -Group instruction provided by PowerPoint slides, verbal discussion, and written materials to support subject matter. The instructor gives an explanation and review of diabetes co-morbidities, pre- and post-prandial blood glucose goals, pre-exercise blood glucose goals, signs, symptoms, and treatment of hypoglycemia and hyperglycemia, and foot care basics.   Diabetes Blitz:  -Group instruction provided by PowerPoint slides, verbal discussion, and written materials to support subject matter. The instructor gives an explanation and review of the physiology behind type 1 and type 2 diabetes, diabetes medications and rational behind using different medications, pre- and post-prandial blood glucose recommendations and Hemoglobin A1c goals, diabetes diet,  and exercise including blood glucose guidelines for exercising safely.    Portion Distortion:  -Group instruction provided by PowerPoint slides, verbal discussion, written materials, and food models to support subject matter. The instructor gives an explanation of serving size versus portion size, changes in portions sizes over the last 20 years, and what consists of a serving from each food group.   Stress Management:  -Group instruction provided by verbal instruction, video, and written materials to support subject matter.  Instructors review role of stress in heart disease and how to cope with stress positively.     Exercising on Your Own:  -Group instruction provided by verbal instruction, power point, and written materials to support subject.  Instructors discuss benefits of exercise, components of exercise, frequency and intensity of exercise, and end points for exercise.  Also discuss use of nitroglycerin and activating EMS.  Review options of places to exercise outside of rehab.  Review guidelines for sex with heart disease.   Cardiac Drugs I:  -Group instruction provided by verbal instruction and written materials to support subject.  Instructor reviews cardiac drug classes: antiplatelets, anticoagulants, beta blockers, and statins.  Instructor discusses reasons, side effects, and lifestyle considerations for each drug class.   CARDIAC REHAB PHASE II EXERCISE from 03/08/2017 in [facility]  Date  02/27/17  Instruction Review Code  2- meets goals/outcomes      Cardiac Drugs II:  -Group instruction provided by verbal instruction and written materials to support subject.  Instructor reviews cardiac drug classes: angiotensin converting enzyme inhibitors (ACE-I), angiotensin II receptor blockers (ARBs), nitrates, and calcium channel blockers.  Instructor discusses reasons, side effects, and lifestyle considerations for each drug class.   Anatomy and  Physiology of the Circulatory System:  -Group  instruction provided by verbal instruction, video, and written materials to support subject.  Reviews functional anatomy of heart, how it relates to various diagnoses, and what role the heart plays in the overall system.   Knowledge Questionnaire Score:     Knowledge Questionnaire Score - 02/19/17 1134      Knowledge Questionnaire Score   Pre Score 22/24      Core Components/Risk Factors/Patient Goals at Admission:     Personal Goals and Risk Factors at Admission - 03/06/17 1218      Core Components/Risk Factors/Patient Goals on Admission    Weight Management Yes;Weight Loss   Intervention Weight Management: Develop a combined nutrition and exercise program designed to reach desired caloric intake, while maintaining appropriate intake of nutrient and fiber, sodium and fats, and appropriate energy expenditure required for the weight goal.;Weight Management: Provide education and appropriate resources to help participant work on and attain dietary goals.   Admit Weight 212 lb (96.2 kg)   Goal Weight: Short Term 206 lb (93.4 kg)   Goal Weight: Long Term 197 lb (89.4 kg)   Expected Outcomes Short Term: Continue to assess and modify interventions until short term weight is achieved;Long Term: Adherence to nutrition and physical activity/exercise program aimed toward attainment of established weight goal;Weight Loss: Understanding of general recommendations for a balanced deficit meal plan, which promotes 1-2 lb weight loss per week and includes a negative energy balance of [phone] kcal/d;Understanding of distribution of calorie intake throughout the day with the consumption of 4-5 meals/snacks   Hypertension Yes   Intervention Provide education on lifestyle modifcations including regular physical activity/exercise, weight management, moderate sodium restriction and increased consumption of fresh fruit, vegetables, and low fat dairy, alcohol  moderation, and smoking cessation.;Monitor prescription use compliance.   Expected Outcomes Short Term: Continued assessment and intervention until BP is < 140/43m HG in hypertensive participants. < 130/861mHG in hypertensive participants with diabetes, heart failure or chronic kidney disease.;Long Term: Maintenance of blood pressure at goal levels.   Lipids Yes   Intervention Provide education and support for participant on nutrition & aerobic/resistive exercise along with prescribed medications to achieve LDL <7057mHDL >7m27m Expected Outcomes Short Term: Participant states understanding of desired cholesterol values and is compliant with medications prescribed. Participant is following exercise prescription and nutrition guidelines.;Long Term: Cholesterol controlled with medications as prescribed, with individualized exercise RX and with personalized nutrition plan. Value goals: LDL < 70mg73mL > 40 mg.      Core Components/Risk Factors/Patient Goals Review:    Core Components/Risk Factors/Patient Goals at Discharge (Final Review):    ITP Comments:     ITP Comments    Row Name 02/19/17 1006 03/01/17 1112         ITP Comments Dr. TraciFransico Himical Director  Patient attended HTN class on 03/01/2017         Comments:  Pt is making expected progress toward personal goals after completing 9 sessions. Pt is off to a good start.Psychosocial Assessment Pt has a positive outlook and healthy coping skills. Pt has good support from his wife.  Recommend continued exercise and life style modification education including  stress management and relaxation techniques to decrease cardiac risk profile. CarleCherre Huger Cardiac and PulmoTraining and development officer

## 2017-03-14 ENCOUNTER — Encounter: Payer: Self-pay | Admitting: Cardiology

## 2017-03-14 ENCOUNTER — Ambulatory Visit (INDEPENDENT_AMBULATORY_CARE_PROVIDER_SITE_OTHER): Payer: Medicare Other | Admitting: Cardiology

## 2017-03-14 VITALS — BP 122/68 | HR 85 | Ht 74.0 in | Wt 205.5 lb

## 2017-03-14 DIAGNOSIS — I200 Unstable angina: Secondary | ICD-10-CM

## 2017-03-14 DIAGNOSIS — E7800 Pure hypercholesterolemia, unspecified: Secondary | ICD-10-CM | POA: Diagnosis not present

## 2017-03-14 DIAGNOSIS — I2510 Atherosclerotic heart disease of native coronary artery without angina pectoris: Secondary | ICD-10-CM | POA: Diagnosis not present

## 2017-03-14 DIAGNOSIS — Z951 Presence of aortocoronary bypass graft: Secondary | ICD-10-CM | POA: Diagnosis not present

## 2017-03-14 DIAGNOSIS — I10 Essential (primary) hypertension: Secondary | ICD-10-CM | POA: Diagnosis not present

## 2017-03-14 DIAGNOSIS — Z79899 Other long term (current) drug therapy: Secondary | ICD-10-CM | POA: Diagnosis not present

## 2017-03-14 LAB — LIPID PANEL
CHOLESTEROL TOTAL: 121 mg/dL (ref 100–199)
Chol/HDL Ratio: 3.4 ratio (ref 0.0–5.0)
HDL: 36 mg/dL — ABNORMAL LOW (ref >39)
LDL Calculated: 65 mg/dL (ref 0–99)
TRIGLYCERIDES: 101 mg/dL (ref 0–149)
VLDL Cholesterol Cal: 20 mg/dL (ref 5–40)

## 2017-03-14 LAB — ALT: ALT: 35 IU/L (ref 0–44)

## 2017-03-14 NOTE — Progress Notes (Signed)
Cardiology Office Note    Date:  03/14/2017   ID:  [name], DOB Jun 07, 1951, MRN [id number]  PCP:  No PCP Per Patient  Cardiologist:   Donato Schultz, MD     History of Present Illness:  [name] is a 66 y.o. male here for Follow-up of bypass surgery February 2018.  Also has a history of hypertension and hyperlipidemia as well as single functioning kidney.  He underwent CABG 4 on 01/11/17 with LIMA to LAD, vein to diagonal, vein to obtuse marginal and vein to RCA.  Overall he is been doing quite well. Occasional palpitations, PACs noted on telemetry. Brief SVT noted in urgent care prior to catheterization. Is a history of paroxysmal atrial fibrillation many many years ago after using too much caffeine.    To quote Dr. Charlestine Night: 66 year old male with history of HLD, HTN in town for his sister's funeral with chest pain. Originally from [place], lives now in [place].    He has noticed progressive chest pressure with exertion over the past 2 weeks which has been progressive and resolved with rest.  He previously saw urgent care for this and was referred to [facility] on 12/25/16 (Dr. Vick Frees) who has set him up for an outpatient exercise treadmill stress.  Today, he was "discussing a sensitive subject" with his family and then while having a bowel movement and straining had chest pain, diaphoresis, and radiation down his left arm.  He took 2 325 ASA and presented to the ED.  ECG abnormal with ST depressions V3.  1st troponin negative.  No shortness of breath.  Previous smoker.  No significant ETOH use.  No early family history of CAD.  Previously took Lipitor (lipids added-on, non-fasting).  Denies other significant PMHx other than psoriasis.  Born with 1 kidney.   Prior chest pain was mainly with exertion, lifting heavy objects, pushing heavy carts.   He had stress test in 1990's and 2004 - normal.   Yesterday he states he did not have  any chest pain and on Sunday they celebrated his sister's life. His sister died in August 23, 2017after car accident. He moved down here from [place] to help settle her estate. 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 On 12/11/16 when lifting a heavy box, he felt a funny feeling in his chest. He's felt a proximally doesn't times since then. [name] is his significant other who is in [place]. He also has a daughter.  The most concerning episode happened a few days ago when [place] when he began to feel shortness of breath, significant chest pain, left arm pain and diaphoresis that lasted approximate 20 minutes relieved with rest. He said other episodes that are similar to this but less intense.   Oct. Fishing [place] fishing, mild altitude related SOB. No CP.   No DM, No CAD. Mother died 40 CHF. Bb, statin just started. No physical exam in 7 years.   03/14/17-overall doing very well in cardiac rehabilitation. In fact he can increase his heart rate during rehabilitation efforts. He's not having any anginal symptoms. He feels the best he has felt in over 3 years. He showed me his blood pressure recordings, occasionally they will be a little low and then sometimes high. Not extremely labile. In the morning hours sometimes they are increased.  Past Medical History:  Diagnosis Date  . Chest pain in adult 01/09/2017  . Coronary  artery disease   . Renal disorder    born with 1 funcitoning kidney    Past Surgical History:  Procedure Laterality Date  . ARTERIAL LINE INSERTION Right 01/11/2017   Procedure: FEMORAL ARTERIAL LINE INSERTION ;16 GAUGE;  Surgeon: [name], MD;  Location: [facility] OR;  Service: Open Heart Surgery;  Laterality: Right;  . CARDIAC CATHETERIZATION    . CORONARY ARTERY BYPASS GRAFT N/A 01/11/2017   Procedure: CORONARY ARTERY BYPASS GRAFTING (CABG) x 4 (LIMA to LAD, SVG to DIAGONAL, SVG to OM, SVG to RCA) with ENDOVEIN HARVEST OF RIGHT  GREATER SAPHENOUS VEIN;  Surgeon: [name], MD;  Location: [facility] OR;  Service: Open Heart Surgery;  Laterality: N/A;  . ENDOVEIN HARVEST OF GREATER SAPHENOUS VEIN Right 01/11/2017   Procedure: ENDOVEIN HARVEST OF GREATER SAPHENOUS VEIN;  Surgeon: [name], MD;  Location: [facility] OR;  Service: Open Heart Surgery;  Laterality: Right;  . LEFT HEART CATH AND CORONARY ANGIOGRAPHY N/A 01/09/2017   Procedure: Left Heart Cath and Coronary Angiography;  Surgeon: Lyn Records, MD;  Location: [facility] INVASIVE CV LAB;  Service: Cardiovascular;  Laterality: N/A;  . SPHENOIDECTOMY    . TEE WITHOUT CARDIOVERSION N/A 01/11/2017   Procedure: TRANSESOPHAGEAL ECHOCARDIOGRAM (TEE);  Surgeon: [name], MD;  Location: [facility] OR;  Service: Open Heart Surgery;  Laterality: N/A;    Current Medications: Outpatient Medications Prior to Visit  Medication Sig Dispense Refill  . aspirin EC 81 MG tablet Take 81 mg by mouth daily.    . cetirizine (ZYRTEC) 10 MG tablet Take 10 mg by mouth daily.    . Cholecalciferol (VITAMIN D-3) 5000 units TABS Take 15,000 Units by mouth daily.     Marland Kitchen lisinopril (PRINIVIL,ZESTRIL) 2.5 MG tablet TAKE 1 TABLET BY MOUTH EVERY DAY 30 tablet 10  . metoprolol tartrate (LOPRESSOR) 25 MG tablet Take 0.5 tablets (12.5 mg total) by mouth 2 (two) times daily. 30 tablet 1  . Multiple Vitamin (MULTIVITAMIN WITH MINERALS) TABS tablet Take 1 tablet by mouth daily.    . Omega-3 Fatty Acids (HM FISH OIL PO) Take 800 mg by mouth daily.    Marland Kitchen atorvastatin (LIPITOR) 80 MG tablet Take 1 tablet (80 mg total) by mouth daily at 6 PM. 30 tablet 1  . acetaminophen (TYLENOL) 325 MG tablet Take 2 tablets (650 mg total) by mouth every 6 (six) hours as needed for mild pain. (Patient not taking: Reported on 02/25/2017)     No facility-administered medications prior to visit.      Allergies:   Shrimp [shellfish allergy]   Social History   Social History  . Marital status: Divorced    Spouse name: N/A  .  Number of children: N/A  . Years of education: N/A   Social History Main Topics  . Smoking status: Never Smoker  . Smokeless tobacco: Never Used  . Alcohol use No  . Drug use: No  . Sexual activity: Not Asked   Other Topics Concern  . None   Social History Narrative  . None     Family History:  No early CAD   ROS:   Please see the history of present illness.    ROS All other systems reviewed and are negative.   PHYSICAL EXAM:   VS:  BP 122/68   Pulse 85   Ht  (1.88 m)   Wt 205 lb 8 oz (93.2 kg)   SpO2 96%   BMI 26.38 kg/m    GEN: Well  nourished, well developed, in no acute distress  HEENT: normal  Neck: no JVD, carotid bruits, or masses Cardiac: RRR; no murmurs, rubs, or gallops,no edema  Respiratory:  clear to auscultation bilaterally, normal work of breathing GI: soft, nontender, nondistended, + BS MS: no deformity or atrophy  Skin: warm and dry, no rash Neuro:  Alert and Oriented x 3, Strength and sensation are intact Psych: euthymic mood, full affect  Wt Readings from Last 3 Encounters:  03/14/17 205 lb 8 oz (93.2 kg)  02/19/17 212 lb 4.9 oz (96.3 kg)  02/14/17 205 lb (93 kg)      Studies/Labs Reviewed:   EKG:  01/05/17 - NSR with very subtle ST depression (NSSTW changes) V3-4, Personally viewed. EKG from 12/12/16 shows heart rate 90, PACs, nonspecific ST-T wave changes.  Recent Labs: 01/10/2017: ALT 25 01/12/2017: Magnesium 2.4 01/14/2017: Hemoglobin 10.7 01/29/2017: BUN 19; Creatinine, Ser 0.87; Platelets 462; Potassium 4.6; Sodium 135   Lipid Panel No results found for: CHOL, TRIG, HDL, CHOLHDL, VLDL, LDLCALC, LDLDIRECT  Additional studies/ records that were reviewed today include:  CXR - WNL Trop normal ER visit notes reviewed.  ECHO 01/10/17 Study Conclusions  - Left ventricle: The cavity size was normal. Wall thickness was increased in a pattern of moderate LVH. Systolic function was normal. The estimated ejection fraction was in  the range of 60% to 65%. Wall motion was normal; there were no regional wall motion abnormalities. Doppler parameters are consistent with abnormal left ventricular relaxation (grade 1 diastolic dysfunction). Doppler parameters are consistent with indeterminate ventricular filling pressure. - Aortic valve: Transvalvular velocity was within the normal range. There was no stenosis. There was no regurgitation. Valve area (VTI): 1.62 cm^2. Valve area (Vmax): 1.65 cm^2. Valve area (Vmean): 1.6 cm^2. - Mitral valve: Transvalvular velocity was within the normal range. There was no evidence for stenosis. There was no regurgitation. Valve area by pressure half-time: 1.95 cm^2. - Right ventricle: The cavity size was normal. Wall thickness was normal. Systolic function was normal. - Atrial septum: No defect or patent foramen ovale was identified by color flow Doppler. - Tricuspid valve: There was no regurgitation.   Arterial dopplers:  01/10/17 Summary:  - The vertebral arteries appear patent with antegrade flow. - Findings consistent with 1-39 percent stenosis involving the right internal carotid artery and the left internal carotid artery. - Pedal artery waveforms within normal limits.   CABG 01/11/17 SURGICAL PROCEDURE: Coronary artery bypass grafting x4 with the left internal mammary to the left anterior descending coronary artery, reverse saphenous vein graft to the distal right coronary artery, reverse saphenous vein graft to the diagonal coronary artery and reverse saphenous vein graft to the circumflex coronary artery with right greater saphenous thigh and calf endo-vein harvesting and placement of right femoral arterial line.   Cardiac cath 01/09/17  Ostial to proximal 50% region of stenosis in the LAD followed by proximal to mid 99% stenosis distal to a large first diagonal. First agonal eccentric 50-60% stenosis.  Diffuse generalized  narrowing of the left main, less than 50%  Ostial to proximal eccentric 50% right coronary.  Normal LV function. EF 65%.   RECOMMENDATIONS:   After considering treatment options, we will discuss with TCTS. I am concerned with treating the proximal to mid LAD 99% lesion and leaving behind ostial disease. Best treatment option in this vigorous outdoorsman would be LIMA to LAD and SVG to diagonal. Discuss whether the right and circumflex should be grafted.  Keep in the hospital.  ACS dose Lovenox.  ASSESSMENT:    1. S/P CABG (coronary artery bypass graft)   2. Pure hypercholesterolemia   3. Encounter for long-term (current) use of medications   4. CAD in native artery   5. Essential hypertension      PLAN:  In order of problems listed above:   Coronary artery disease status post CABG 4  - Overall doing well.  - Continue to exercise.  - Medications reviewed.  Essential hypertension  - Well controlled. No changes made.  Hyperlipidemia  - On high-intensity statin therapy.  - Checking lipid panels and ALT  Angina  -No further symptoms post bypass.  - He has had multiple episodes that seem to be escalating, increasing. Worst episode was while walking down long hallway at [facility] developing central chest pain with left arm radiation and diaphoresis that relieved itself after 20 minutes. These were his symptoms prior to bypass.   Multiple allergies  - list reviewed  - We discussed shrimp allergy - he said that he only develops a runny nose occasionally when eating shrimp. He has never developed any trouble breathing, no hives or any anaphylactic reaction he did not have any trouble with cardiac catheterization after Benadryl.   Psoriasis  - controlled  Solitary kidney  Stable, doing well. Last creatinine 0.87.    Medication Adjustments/Labs and Tests Ordered: Current medicines are reviewed at length with the patient today.  Concerns regarding medicines are  outlined above.  Medication changes, Labs and Tests ordered today are listed in the Patient Instructions below. Patient Instructions  Medication Instructions:  The current medical regimen is effective;  continue present plan and medications.  Labwork: Please have blood work today (Lipid/ALT)  Follow-Up: Follow up as needed with Dr Anne Fu.  Thank you for choosing [facility]!!        Signed, Donato Schultz, MD  03/14/2017 10:42 AM    [facility] [address] [place], [place], [place]  [address] Phone: [phone]; Fax: [phone]

## 2017-03-14 NOTE — Patient Instructions (Signed)
Medication Instructions:  The current medical regimen is effective;  continue present plan and medications.  Labwork: Please have blood work today (Lipid/ALT)  Follow-Up: Follow up as needed with Dr Anne Fu.  Thank you for choosing [facility]!!

## 2017-03-15 ENCOUNTER — Other Ambulatory Visit: Payer: Self-pay | Admitting: Physician Assistant

## 2017-03-15 ENCOUNTER — Encounter (HOSPITAL_COMMUNITY)
Admission: RE | Admit: 2017-03-15 | Discharge: 2017-03-15 | Disposition: A | Discharge status: Home / Self Care | Payer: Medicare Other | Source: Ambulatory Visit | Attending: Cardiology | Admitting: Cardiology

## 2017-03-15 DIAGNOSIS — Z951 Presence of aortocoronary bypass graft: Secondary | ICD-10-CM | POA: Diagnosis not present

## 2017-03-18 ENCOUNTER — Encounter (HOSPITAL_COMMUNITY)
Admission: RE | Admit: 2017-03-18 | Discharge: 2017-03-18 | Disposition: A | Discharge status: Home / Self Care | Payer: Medicare Other | Source: Ambulatory Visit | Attending: Cardiology | Admitting: Cardiology

## 2017-03-18 DIAGNOSIS — Z951 Presence of aortocoronary bypass graft: Secondary | ICD-10-CM

## 2017-03-20 ENCOUNTER — Encounter (HOSPITAL_COMMUNITY)
Admission: RE | Admit: 2017-03-20 | Discharge: 2017-03-20 | Disposition: A | Discharge status: Home / Self Care | Payer: Medicare Other | Source: Ambulatory Visit | Attending: Cardiology | Admitting: Cardiology

## 2017-03-20 DIAGNOSIS — Z951 Presence of aortocoronary bypass graft: Secondary | ICD-10-CM | POA: Diagnosis present

## 2017-03-22 ENCOUNTER — Other Ambulatory Visit: Payer: Self-pay | Admitting: Cardiology

## 2017-03-22 ENCOUNTER — Encounter (HOSPITAL_COMMUNITY)
Admission: RE | Admit: 2017-03-22 | Discharge: 2017-03-22 | Disposition: A | Discharge status: Home / Self Care | Payer: Medicare Other | Source: Ambulatory Visit | Attending: Cardiology | Admitting: Cardiology

## 2017-03-22 DIAGNOSIS — Z951 Presence of aortocoronary bypass graft: Secondary | ICD-10-CM

## 2017-03-25 ENCOUNTER — Telehealth (HOSPITAL_COMMUNITY): Payer: Self-pay

## 2017-03-25 ENCOUNTER — Encounter (HOSPITAL_COMMUNITY)
Admission: RE | Admit: 2017-03-25 | Discharge: 2017-03-25 | Disposition: A | Discharge status: Home / Self Care | Payer: Medicare Other | Source: Ambulatory Visit | Attending: Cardiology | Admitting: Cardiology

## 2017-03-25 DIAGNOSIS — Z951 Presence of aortocoronary bypass graft: Secondary | ICD-10-CM | POA: Diagnosis not present

## 2017-03-25 NOTE — Telephone Encounter (Signed)
-----   Message from Jake BatheMark C Skains, MD sent at 03/24/2017  7:07 AM EDT ----- Regarding: RE: THR increase OK with me Donato SchultzMark Skains, MD  ----- Message ----- From: Warrick ParisianFair, Jonia Oakey D Sent: 03/20/2017   4:32 PM To: Jake BatheMark C Skains, MD Subject: Annell GreeningFW: THR increase                                 ----- Message ----- From: Paulita FujitaAmber D Majour Frei Sent: 03/13/2017  10:16 AM To: Jake BatheMark C Skains, MD Subject: THR increase                                   Patient has been in cardiac rehab approximately 3 weeks and is doing well. BP's WNL's, but HR's are beginning to exceed Target Heart Rate (THR) of 61-122 bpm (40%-80% of age predicted max HR). If no GXT is planned for the near future and MD agrees, request to increase THR to 50-90% of age predicted max HR 61-137 bpm.      Thank you!   Insurance account managerAmber Ginnette Gates

## 2017-03-27 ENCOUNTER — Encounter (HOSPITAL_COMMUNITY)
Admission: RE | Admit: 2017-03-27 | Discharge: 2017-03-27 | Disposition: A | Discharge status: Home / Self Care | Payer: Medicare Other | Source: Ambulatory Visit | Attending: Cardiology | Admitting: Cardiology

## 2017-03-27 DIAGNOSIS — Z951 Presence of aortocoronary bypass graft: Secondary | ICD-10-CM | POA: Diagnosis not present

## 2017-03-29 ENCOUNTER — Encounter (HOSPITAL_COMMUNITY)
Admission: RE | Admit: 2017-03-29 | Discharge: 2017-03-29 | Disposition: A | Discharge status: Home / Self Care | Payer: Medicare Other | Source: Ambulatory Visit | Attending: Cardiology | Admitting: Cardiology

## 2017-03-29 DIAGNOSIS — Z951 Presence of aortocoronary bypass graft: Secondary | ICD-10-CM

## 2017-04-01 ENCOUNTER — Encounter (HOSPITAL_COMMUNITY)
Admission: RE | Admit: 2017-04-01 | Discharge: 2017-04-01 | Disposition: A | Discharge status: Home / Self Care | Payer: Medicare Other | Source: Ambulatory Visit | Attending: Cardiology | Admitting: Cardiology

## 2017-04-01 DIAGNOSIS — Z951 Presence of aortocoronary bypass graft: Secondary | ICD-10-CM

## 2017-04-03 ENCOUNTER — Encounter (HOSPITAL_COMMUNITY)
Admission: RE | Admit: 2017-04-03 | Discharge: 2017-04-03 | Disposition: A | Discharge status: Home / Self Care | Payer: Medicare Other | Source: Ambulatory Visit | Attending: Cardiology | Admitting: Cardiology

## 2017-04-03 DIAGNOSIS — Z951 Presence of aortocoronary bypass graft: Secondary | ICD-10-CM | POA: Diagnosis not present

## 2017-04-05 ENCOUNTER — Other Ambulatory Visit: Payer: Self-pay | Admitting: Physician Assistant

## 2017-04-05 ENCOUNTER — Encounter (HOSPITAL_COMMUNITY)
Admission: RE | Admit: 2017-04-05 | Discharge: 2017-04-05 | Disposition: A | Discharge status: Home / Self Care | Payer: Medicare Other | Source: Ambulatory Visit | Attending: Cardiology | Admitting: Cardiology

## 2017-04-05 DIAGNOSIS — Z951 Presence of aortocoronary bypass graft: Secondary | ICD-10-CM | POA: Diagnosis not present

## 2017-04-08 ENCOUNTER — Encounter (HOSPITAL_COMMUNITY)
Admission: RE | Admit: 2017-04-08 | Discharge: 2017-04-08 | Disposition: A | Discharge status: Home / Self Care | Payer: Medicare Other | Source: Ambulatory Visit | Attending: Cardiology | Admitting: Cardiology

## 2017-04-08 DIAGNOSIS — Z951 Presence of aortocoronary bypass graft: Secondary | ICD-10-CM | POA: Diagnosis not present

## 2017-04-09 NOTE — Progress Notes (Signed)
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  aspirin EC 81 MG tablet, Take 81 mg by mouth daily., Disp: , Rfl:  .  atorvastatin (LIPITOR) 80 MG tablet, Take 1 tablet (80 mg total) by mouth daily at 6 PM., Disp: 30 tablet, Rfl: 1 .  cetirizine (ZYRTEC) 10 MG tablet, Take 10 mg by mouth daily., Disp: , Rfl:  .  Cholecalciferol (VITAMIN D-3) 5000 units TABS, Take 15,000 Units by mouth daily. , Disp: , Rfl:  .  lisinopril (PRINIVIL,ZESTRIL) 2.5 MG tablet, TAKE 1 TABLET BY MOUTH EVERY DAY, Disp: 30 tablet, Rfl: 10 .  metoprolol tartrate (LOPRESSOR) 25 MG tablet, TAKE 1/2 TABLET BY MOUTH TWICE DAILY, Disp: 90 tablet, Rfl: 3 .  Multiple Vitamin (MULTIVITAMIN WITH MINERALS) TABS tablet, Take 1 tablet by mouth daily., Disp: , Rfl:  .  Omega-3 Fatty Acids (HM FISH OIL PO), Take 800 mg by mouth daily., Disp: , Rfl:   Past Medical History: Past Medical History:  Diagnosis Date  . Chest pain in adult 01/09/2017  . Coronary artery disease   . 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 Never Smoker  Smokeless Tobacco  . Never Used    Labs: Recent Review Flowsheet Data    Labs for ITP Cardiac and Pulmonary Rehab Latest Ref Rng & Units 01/11/2017 01/11/2017 01/11/2017 01/12/2017 03/14/2017   Cholestrol 100 - 199 mg/dL - - - - 121   LDLCALC 0 - 99 mg/dL - - - - 65   HDL >39 mg/dL - - - -  36(L)   Trlycerides 0 - 149 mg/dL - - - - 101   Hemoglobin A1c 4.8 - 5.6 % - - - - -   PHART 7.350 - 7.450 7.486(H) 7.329(L) - - -   PCO2ART 32.0 - 48.0 mmHg 26.9(L) 42.9 - - -   HCO3 20.0 - 28.0 mmol/L 20.3 22.6 - - -   TCO2 0 - 100 mmol/L _0 -   ACIDBASEDEF 0.0 - 2.0 mmol/L 2.0 3.0(H) - - -   O2SAT % 97.0 97.0 - - -      Capillary Blood Glucose: Lab Results  Component Value Date   GLUCAP 126 (H) 01/15/2017   GLUCAP 91 01/15/2017   GLUCAP 116 (H) 01/14/2017   GLUCAP 102 (H) 01/14/2017   GLUCAP 121 (H) 01/14/2017     Exercise Target Goals:    Exercise Program Goal: Individual exercise prescription set with THRR, safety & activity barriers. Participant demonstrates ability to understand and report RPE using BORG scale, to self-measure pulse accurately, and to acknowledge the importance of the exercise prescription.  Exercise Prescription Goal: Starting with aerobic activity 30 plus minutes a day, 3 days per week for initial exercise prescription. Provide home exercise prescription and guidelines that participant acknowledges understanding prior to discharge.  Activity Barriers & Risk Stratification:  Activity Barriers & Cardiac Risk Stratification - 02/19/17 1205      Activity Barriers & Cardiac Risk Stratification   Activity Barriers Back Problems;Other (comment)   Comments Right knee scope x2, left knee scope x1. History of low back pain.   Cardiac Risk Stratification High      6 Minute Walk:     6 Minute Walk    Row Name 02/19/17 1203         6 Minute Walk   Phase Initial     Distance 1838 feet     Walk Time 6 minutes     # of Rest Breaks 0     MPH 3.48     METS 4.16     RPE 11     VO2 Peak 14.57     Symptoms Yes (comment)     Comments Participant c/o left knee soreness at end of walk test.     Resting HR 87 bpm     Resting BP 122/81     Max Ex. HR 112 bpm     Max Ex. BP 128/76     2 Minute Post BP 102/60        Oxygen Initial  Assessment:   Oxygen Re-Evaluation:   Oxygen Discharge (Final Oxygen Re-Evaluation):   Initial Exercise Prescription:     Initial Exercise Prescription - 03/25/17 1100      Intensity   THRR 40-80% of Max Heartrate 61-137      Perform Capillary Blood Glucose checks as needed.  Exercise Prescription Changes:     Exercise Prescription Changes    Row Name 03/11/17 1700 03/25/17 1100 04/09/17 1200         Response to Exercise   Blood Pressure (Admit) 110/80 122/70 108/66     Blood Pressure (Exercise) 124/80 148/82 124/82     Blood Pressure (Exit) 104/68 100/68 104/70     Heart Rate (Admit) 85 bpm 89 bpm 96 bpm     Heart Rate (Exercise) 120 bpm 115 bpm 120 bpm     Heart Rate (Exit) 91 bpm 89 bpm 101 bpm     Rating of Perceived Exertion (Exercise) 111 11 13     Symptoms none none none     Comments  - THR increase  61-137 THR increase  61-137     Duration Continue with 30 min of aerobic exercise without signs/symptoms of physical distress. Continue with 30 min of aerobic exercise without signs/symptoms of physical distress. Continue with 30 min of aerobic exercise without signs/symptoms of physical distress.     Intensity THRR unchanged THRR New THRR New       Progression   Progression Continue to progress workloads to maintain intensity without signs/symptoms of physical distress. Continue to progress workloads to maintain intensity without signs/symptoms of physical distress. Continue to progress workloads to maintain intensity without signs/symptoms of physical distress.     Average METs 3 4.1 4.2       Resistance Training   Training Prescription Yes Yes Yes     Weight 4lbs 4lbs 5lbs     Reps 10-15 10-15 10-15     Time 10 Minutes 10 Minutes 10 Minutes       Treadmill   MPH 3 3.2 3.2     Grade _0 Minutes _1 METs 3.71 4.33 4.33       Bike   Level 1 1.5 2     Minutes  _0 METs 3 3.98 5.09       NuStep   Level _1 SPM 90 90 90      Minutes _2 METs 2.3 2.9 3.1       Home Exercise Plan   Plans to continue exercise at  - Home (comment) Home (comment)     Frequency  - Add 2 additional days to program exercise sessions. Add 2 additional days to program exercise sessions.     Initial Home Exercises Provided  - 03/04/17 03/04/17        Exercise Comments:     Exercise Comments    Row Name 03/13/17 1015 04/04/17 0754         Exercise Comments Reviewed MET's and goals. Pt is tolerating exercise well; will continue to monitor pt's activity levels and exercise progression Reviewed MET's and goals. Pt is tolerating exercise well; will continue to monitor pt's activity levels and exercise progression         Exercise Goals and Review:     Exercise Goals    Row Name 02/19/17 1213 02/19/17 1214           Exercise Goals   Increase Physical Activity Yes -  increase stamina/energy levels      Intervention Provide advice, education, support and counseling about physical activity/exercise needs.;Develop an individualized exercise prescription for aerobic and resistive training based on initial evaluation findings, risk stratification, comorbidities and participant's personal goals.  -      Expected Outcomes Achievement of increased cardiorespiratory fitness and enhanced flexibility, muscular endurance and strength shown through measurements of functional capacity and personal statement of participant.  -      Increase Strength and Stamina Yes -  get active- be able to fish/hike ~530f-10miles      Intervention Provide advice, education, support and counseling about physical activity/exercise needs.;Develop an individualized exercise prescription for aerobic and resistive training based on initial evaluation findings, risk stratification, comorbidities and participant's personal goals.  -      Expected Outcomes Achievement of increased cardiorespiratory fitness and enhanced flexibility, muscular endurance and  strength shown through measurements of functional capacity and personal statement of participant.  -         Exercise Goals Re-Evaluation :     Exercise Goals Re-Evaluation    Row Name 03/11/17 1707 03/13/17 1019 04/04/17 0753         Exercise Goal Re-Evaluation   Exercise Goals Review Increase Physical Activity;Increase Strenth and Stamina  - Increase Physical Activity;Increase Strenth and Stamina     Comments Reviewed HEP on 4/16 with student intern. Pt mode of exercise is walking 2x/week in addition coming to cardiac rehab. Discussed activity progression, warning signs and weather precautions. Pt verbalize understanding. Pt stated " able to walk inclines and negogiate stairs better without fatigue and SOB"  Pt is tolerating WL increases very well; Pt is also down in weight. Current weight is 196lbs     Expected Outcomes Pt will be compliant with HEP and will increase in aerobic capacity and exercise tolerance Pt will be compliant with HEP and will increase in aerobic capacity and exercise tolerance Pt will continue to improve cardiorespiratory fitness and reach goal weight         Discharge Exercise Prescription (Final Exercise Prescription Changes):     Exercise Prescription Changes - 04/09/17 1200      Response to Exercise  Blood Pressure (Admit) 108/66   Blood Pressure (Exercise) 124/82   Blood Pressure (Exit) 104/70   Heart Rate (Admit) 96 bpm   Heart Rate (Exercise) 120 bpm   Heart Rate (Exit) 101 bpm   Rating of Perceived Exertion (Exercise) 13   Symptoms none   Comments THR increase  61-137   Duration Continue with 30 min of aerobic exercise without signs/symptoms of physical distress.   Intensity THRR New     Progression   Progression Continue to progress workloads to maintain intensity without signs/symptoms of physical distress.   Average METs 4.2     Resistance Training   Training Prescription Yes   Weight 5lbs   Reps 10-15   Time 10 Minutes      Treadmill   MPH 3.2   Grade 2   Minutes 10   METs 4.33     Bike   Level 2   Minutes 10   METs 5.09     NuStep   Level 4   SPM 90   Minutes 10   METs 3.1     Home Exercise Plan   Plans to continue exercise at Home (comment)   Frequency Add 2 additional days to program exercise sessions.   Initial Home Exercises Provided 03/04/17      Nutrition:  Target Goals: Understanding of nutrition guidelines, daily intake of sodium '1500mg'$ , cholesterol '200mg'$ , calories 30% from fat and 7% or less from saturated fats, daily to have 5 or more servings of fruits and vegetables.  Biometrics:     Pre Biometrics - 02/19/17 1207      Pre Biometrics   Height '6\' 2"'$  (1.88 m)   Weight 212 lb 4.9 oz (96.3 kg)   Waist Circumference 36.5 inches   Hip Circumference 41.5 inches   Waist to Hip Ratio 0.88 %   BMI (Calculated) 27.3   Triceps Skinfold 18 mm   % Body Fat 25.9 %   Grip Strength 47 kg   Flexibility 0 in   Single Leg Stand 30 seconds       Nutrition Therapy Plan and Nutrition Goals:     Nutrition Therapy & Goals - 03/06/17 1218      Nutrition Therapy   Diet Therapeutic Lifestyle Changes     Personal Nutrition Goals   Nutrition Goal Wt loss of 1-2 lb/week to a wt loss goal of 6-15 lb at graduation from [facility], educate and counsel regarding individualized specific dietary modifications aiming towards targeted core components such as weight, hypertension, lipid management, diabetes, heart failure and other comorbidities.   Expected Outcomes Short Term Goal: Understand basic principles of dietary content, such as calories, fat, sodium, cholesterol and nutrients.;Long Term Goal: Adherence to prescribed nutrition plan.      Nutrition Discharge: Nutrition Scores:     Nutrition Assessments - 02/20/17 1410      MEDFICTS Scores   Pre Score 64      Nutrition Goals Re-Evaluation:   Nutrition Goals  Re-Evaluation:   Nutrition Goals Discharge (Final Nutrition Goals Re-Evaluation):   Psychosocial: Target Goals: Acknowledge presence or absence of significant depression and/or stress, maximize coping skills, provide positive support system. Participant is able to verbalize types and ability to use techniques and skills needed for reducing stress and depression.  Initial Review & Psychosocial Screening:     Initial Psych Review & Screening - 02/19/17 1503      Initial Review   Current  issues with None Identified     Family Dynamics   Good Support System? Yes   Comments Brief psychosocial assessment reveals no issues or concerns      Barriers   Psychosocial barriers to participate in program There are no identifiable barriers or psychosocial needs.      Quality of Life Scores:     Quality of Life - 02/19/17 1134      Quality of Life Scores   Health/Function Pre 22.9 %   Socioeconomic Pre 22.86 %   Psych/Spiritual Pre 22.67 %   Family Pre 22.5 %   GLOBAL Pre 22.79 %      PHQ-9: Recent Review Flowsheet Data    Depression screen [facility] 2/9 02/25/2017   Decreased Interest 0   Down, Depressed, Hopeless 0   PHQ - 2 Score 0     Interpretation of Total Score  Total Score Depression Severity:  1-4 = Minimal depression, 5-9 = Mild depression, 10-14 = Moderate depression, 15-19 = Moderately severe depression, 20-27 = Severe depression   Psychosocial Evaluation and Intervention:     Psychosocial Evaluation - 04/09/17 1621      Psychosocial Evaluation & Interventions   Interventions Stress management education;Encouraged to exercise with the program and follow exercise prescription;Relaxation education   Comments pt is retired and loves bein able to fish when he would like to, good support at home   Continue Psychosocial Services  No Follow up required      Psychosocial Re-Evaluation:   Psychosocial Discharge (Final Psychosocial Re-Evaluation):   Vocational  Rehabilitation: Provide vocational rehab assistance to qualifying candidates.   Vocational Rehab Evaluation & Intervention:     Vocational Rehab - 02/19/17 1503      Initial Vocational Rehab Evaluation & Intervention   Assessment shows need for Vocational Rehabilitation No  Retired from [employer]      Education: Education Goals: Education classes will be provided on a weekly basis, covering required topics. Participant will state understanding/return demonstration of topics presented.  Learning Barriers/Preferences:     Learning Barriers/Preferences - 02/19/17 1213      Learning Barriers/Preferences   Learning Barriers Hearing;Sight   Learning Preferences Video;Pictoral;Computer/Internet      Education Topics: Count Your Pulse:  -Group instruction provided by verbal instruction, demonstration, patient participation and written materials to support subject.  Instructors address importance of being able to find your pulse and how to count your pulse when at home without a heart monitor.  Patients get hands on experience counting their pulse with staff help and individually.   CARDIAC REHAB PHASE II EXERCISE from 04/05/2017 in [place]  Date  03/22/17  Instruction Review Code  2- meets goals/outcomes      Heart Attack, Angina, and Risk Factor Modification:  -Group instruction provided by verbal instruction, video, and written materials to support subject.  Instructors address signs and symptoms of angina and heart attacks.    Also discuss risk factors for heart disease and how to make changes to improve heart health risk factors.   Functional Fitness:  -Group instruction provided by verbal instruction, demonstration, patient participation, and written materials to support subject.  Instructors address safety measures for doing things around the house.  Discuss how to get up and down off the floor, how to pick things  up properly, how to safely get out of a chair without assistance, and balance training.   CARDIAC REHAB PHASE II EXERCISE from 04/05/2017 in [facility] CARDIAC REHAB  Date  04/05/17  Instruction Review Code  2- meets goals/outcomes      Meditation and Mindfulness:  -Group instruction provided by verbal instruction, patient participation, and written materials to support subject.  Instructor addresses importance of mindfulness and meditation practice to help reduce stress and improve awareness.  Instructor also leads participants through a meditation exercise.    Stretching for Flexibility and Mobility:  -Group instruction provided by verbal instruction, patient participation, and written materials to support subject.  Instructors lead participants through series of stretches that are designed to increase flexibility thus improving mobility.  These stretches are additional exercise for major muscle groups that are typically performed during regular warm up and cool down.   CARDIAC REHAB PHASE II EXERCISE from 04/05/2017 in [facility] CARDIAC REHAB  Date  03/15/17  Educator  Joice Lofts Fair  Instruction Review Code  2- meets goals/outcomes      Hands Only CPR:  -Group verbal, video, and participation provides a basic overview of AHA guidelines for community CPR. Role-play of emergencies allow participants the opportunity to practice calling for help and chest compression technique with discussion of AED use.   Hypertension: -Group verbal and written instruction that provides a basic overview of hypertension including the most recent diagnostic guidelines, risk factor reduction with self-care instructions and medication management.    Nutrition I class: Heart Healthy Eating:  -Group instruction provided by PowerPoint slides, verbal discussion, and written materials to support subject matter. The instructor gives an explanation and review of the Therapeutic  Lifestyle Changes diet recommendations, which includes a discussion on lipid goals, dietary fat, sodium, fiber, plant stanol/sterol esters, sugar, and the components of a well-balanced, healthy diet.   CARDIAC REHAB PHASE II EXERCISE from 04/05/2017 in [facility] CARDIAC REHAB  Date  03/19/17  Educator  RD  Instruction Review Code  2- meets goals/outcomes      Nutrition II class: Lifestyle Skills:  -Group instruction provided by PowerPoint slides, verbal discussion, and written materials to support subject matter. The instructor gives an explanation and review of label reading, grocery shopping for heart health, heart healthy recipe modifications, and ways to make healthier choices when eating out.   Diabetes Question & Answer:  -Group instruction provided by PowerPoint slides, verbal discussion, and written materials to support subject matter. The instructor gives an explanation and review of diabetes co-morbidities, pre- and post-prandial blood glucose goals, pre-exercise blood glucose goals, signs, symptoms, and treatment of hypoglycemia and hyperglycemia, and foot care basics.   CARDIAC REHAB PHASE II EXERCISE from 04/05/2017 in [facility] CARDIAC REHAB  Date  03/29/17  Educator  RD  Instruction Review Code  2- meets goals/outcomes      Diabetes Blitz:  -Group instruction provided by PowerPoint slides, verbal discussion, and written materials to support subject matter. The instructor gives an explanation and review of the physiology behind type 1 and type 2 diabetes, diabetes medications and rational behind using different medications, pre- and post-prandial blood glucose recommendations and Hemoglobin A1c goals, diabetes diet, and exercise including blood glucose guidelines for exercising safely.    Portion Distortion:  -Group instruction provided by PowerPoint slides, verbal discussion, written materials, and food models to support subject matter. The  instructor gives an explanation of serving size versus portion size, changes in portions sizes over the last 20 years, and what consists of a serving from each food group.   CARDIAC REHAB PHASE II EXERCISE from  04/05/2017 in [place]  Date  03/13/17  Educator  RD  Instruction Review Code  2- meets goals/outcomes      Stress Management:  -Group instruction provided by verbal instruction, video, and written materials to support subject matter.  Instructors review role of stress in heart disease and how to cope with stress positively.     CARDIAC REHAB PHASE II EXERCISE from 04/05/2017 in [place]  Date  03/20/17  Instruction Review Code  2- meets goals/outcomes      Exercising on Your Own:  -Group instruction provided by verbal instruction, power point, and written materials to support subject.  Instructors discuss benefits of exercise, components of exercise, frequency and intensity of exercise, and end points for exercise.  Also discuss use of nitroglycerin and activating EMS.  Review options of places to exercise outside of rehab.  Review guidelines for sex with heart disease.   CARDIAC REHAB PHASE II EXERCISE from 04/05/2017 in [place]  Date  04/03/17  Instruction Review Code  2- meets goals/outcomes      Cardiac Drugs I:  -Group instruction provided by verbal instruction and written materials to support subject.  Instructor reviews cardiac drug classes: antiplatelets, anticoagulants, beta blockers, and statins.  Instructor discusses reasons, side effects, and lifestyle considerations for each drug class.   CARDIAC REHAB PHASE II EXERCISE from 04/05/2017 in [place]  Date  02/27/17  Instruction Review Code  2- meets goals/outcomes      Cardiac Drugs II:  -Group instruction provided by verbal instruction and written materials to support subject.   Instructor reviews cardiac drug classes: angiotensin converting enzyme inhibitors (ACE-I), angiotensin II receptor blockers (ARBs), nitrates, and calcium channel blockers.  Instructor discusses reasons, side effects, and lifestyle considerations for each drug class.   CARDIAC REHAB PHASE II EXERCISE from 04/05/2017 in [place]  Date  03/27/17  Instruction Review Code  2- meets goals/outcomes      Anatomy and Physiology of the Circulatory System:  Group verbal and written instruction and models provide basic cardiac anatomy and physiology, with the coronary electrical and arterial systems. Review of: AMI, Angina, Valve disease, Heart Failure, Peripheral Artery Disease, Cardiac Arrhythmia, Pacemakers, and the ICD.   Other Education:  -Group or individual verbal, written, or video instructions that support the educational goals of the cardiac rehab program.   Knowledge Questionnaire Score:     Knowledge Questionnaire Score - 02/19/17 1134      Knowledge Questionnaire Score   Pre Score 22/24      Core Components/Risk Factors/Patient Goals at Admission:     Personal Goals and Risk Factors at Admission - 03/06/17 1218      Core Components/Risk Factors/Patient Goals on Admission    Weight Management Yes;Weight Loss   Intervention Weight Management: Develop a combined nutrition and exercise program designed to reach desired caloric intake, while maintaining appropriate intake of nutrient and fiber, sodium and fats, and appropriate energy expenditure required for the weight goal.;Weight Management: Provide education and appropriate resources to help participant work on and attain dietary goals.   Admit Weight 212 lb (96.2 kg)   Goal Weight: Short Term 206 lb (93.4 kg)   Goal Weight: Long Term 197 lb (89.4 kg)   Expected Outcomes Short Term: Continue to assess and modify interventions until short term weight is achieved;Long Term: Adherence to nutrition and  physical activity/exercise program  aimed toward attainment of established weight goal;Weight Loss: Understanding of general recommendations for a balanced deficit meal plan, which promotes 1-2 lb weight loss per week and includes a negative energy balance of [phone] kcal/d;Understanding of distribution of calorie intake throughout the day with the consumption of 4-5 meals/snacks   Hypertension Yes   Intervention Provide education on lifestyle modifcations including regular physical activity/exercise, weight management, moderate sodium restriction and increased consumption of fresh fruit, vegetables, and low fat dairy, alcohol moderation, and smoking cessation.;Monitor prescription use compliance.   Expected Outcomes Short Term: Continued assessment and intervention until BP is < 140/21m HG in hypertensive participants. < 130/849mHG in hypertensive participants with diabetes, heart failure or chronic kidney disease.;Long Term: Maintenance of blood pressure at goal levels.   Lipids Yes   Intervention Provide education and support for participant on nutrition & aerobic/resistive exercise along with prescribed medications to achieve LDL <7066mHDL >69m1m Expected Outcomes Short Term: Participant states understanding of desired cholesterol values and is compliant with medications prescribed. Participant is following exercise prescription and nutrition guidelines.;Long Term: Cholesterol controlled with medications as prescribed, with individualized exercise RX and with personalized nutrition plan. Value goals: LDL < 70mg75mL > 40 mg.      Core Components/Risk Factors/Patient Goals Review:      Goals and Risk Factor Review    Row Name 04/09/17 1620             Core Components/Risk Factors/Patient Goals Review   Personal Goals Review Weight Management/Obesity;Lipids;Hypertension       Review Pt lost about 7 pounds since beginning his participation in CR, blood pressure remain wnl, no recent lipid  panel          Core Components/Risk Factors/Patient Goals at Discharge (Final Review):      Goals and Risk Factor Review - 04/09/17 1620      Core Components/Risk Factors/Patient Goals Review   Personal Goals Review Weight Management/Obesity;Lipids;Hypertension   Review Pt lost about 7 pounds since beginning his participation in CR, blood pressure remain wnl, no recent lipid panel      ITP Comments:     ITP Comments    Row Name 02/19/17 1006 03/01/17 1112         ITP Comments Dr. TraciFransico Himical Director  Patient attended HTN class on 03/01/2017         Comments:  Pt is making expected progress toward personal goals after completing 20 sessions. Psychosocial Assessment Pt has a positive outlook and healthy coping skills. Pt has good support from his wife.. Recommend continued exercise and life style modification education including  stress management and relaxation techniques to decrease cardiac risk profile. CarleCherre Huger Cardiac and PulmoTraining and development officer

## 2017-04-10 ENCOUNTER — Encounter (HOSPITAL_COMMUNITY)
Admission: RE | Admit: 2017-04-10 | Discharge: 2017-04-10 | Disposition: A | Discharge status: Home / Self Care | Payer: Medicare Other | Source: Ambulatory Visit | Attending: Cardiology | Admitting: Cardiology

## 2017-04-10 DIAGNOSIS — Z951 Presence of aortocoronary bypass graft: Secondary | ICD-10-CM

## 2017-04-12 ENCOUNTER — Encounter (HOSPITAL_COMMUNITY)
Admission: RE | Admit: 2017-04-12 | Discharge: 2017-04-12 | Disposition: A | Discharge status: Home / Self Care | Payer: Medicare Other | Source: Ambulatory Visit | Attending: Cardiology | Admitting: Cardiology

## 2017-04-12 DIAGNOSIS — Z951 Presence of aortocoronary bypass graft: Secondary | ICD-10-CM

## 2017-04-16 ENCOUNTER — Other Ambulatory Visit: Payer: Self-pay | Admitting: Cardiology

## 2017-04-17 ENCOUNTER — Encounter (HOSPITAL_COMMUNITY)
Admission: RE | Admit: 2017-04-17 | Discharge: 2017-04-17 | Disposition: A | Discharge status: Home / Self Care | Payer: Medicare Other | Source: Ambulatory Visit | Attending: Cardiology | Admitting: Cardiology

## 2017-04-17 DIAGNOSIS — Z951 Presence of aortocoronary bypass graft: Secondary | ICD-10-CM

## 2017-04-19 ENCOUNTER — Encounter (HOSPITAL_COMMUNITY)
Admission: RE | Admit: 2017-04-19 | Discharge: 2017-04-19 | Disposition: A | Discharge status: Home / Self Care | Payer: Medicare Other | Source: Ambulatory Visit | Attending: Cardiology | Admitting: Cardiology

## 2017-04-19 DIAGNOSIS — Z951 Presence of aortocoronary bypass graft: Secondary | ICD-10-CM | POA: Insufficient documentation

## 2017-04-19 NOTE — Addendum Note (Signed)
Addendum  created 04/19/17 1201 by Ahri Olson, MD   Sign clinical note    

## 2017-04-22 ENCOUNTER — Encounter (HOSPITAL_COMMUNITY)
Admission: RE | Admit: 2017-04-22 | Discharge: 2017-04-22 | Disposition: A | Discharge status: Home / Self Care | Payer: Medicare Other | Source: Ambulatory Visit | Attending: Cardiology | Admitting: Cardiology

## 2017-04-22 DIAGNOSIS — Z951 Presence of aortocoronary bypass graft: Secondary | ICD-10-CM | POA: Diagnosis not present

## 2017-04-24 ENCOUNTER — Encounter (HOSPITAL_COMMUNITY)
Admission: RE | Admit: 2017-04-24 | Discharge: 2017-04-24 | Disposition: A | Discharge status: Home / Self Care | Payer: Medicare Other | Source: Ambulatory Visit | Attending: Cardiology | Admitting: Cardiology

## 2017-04-24 DIAGNOSIS — Z951 Presence of aortocoronary bypass graft: Secondary | ICD-10-CM

## 2017-04-26 ENCOUNTER — Encounter (HOSPITAL_COMMUNITY)
Admission: RE | Admit: 2017-04-26 | Discharge: 2017-04-26 | Disposition: A | Discharge status: Home / Self Care | Payer: Medicare Other | Source: Ambulatory Visit | Attending: Cardiology | Admitting: Cardiology

## 2017-04-26 DIAGNOSIS — Z951 Presence of aortocoronary bypass graft: Secondary | ICD-10-CM | POA: Diagnosis not present

## 2017-04-29 ENCOUNTER — Encounter (HOSPITAL_COMMUNITY)
Admission: RE | Admit: 2017-04-29 | Discharge: 2017-04-29 | Disposition: A | Discharge status: Home / Self Care | Payer: Medicare Other | Source: Ambulatory Visit | Attending: Cardiology | Admitting: Cardiology

## 2017-04-29 DIAGNOSIS — Z951 Presence of aortocoronary bypass graft: Secondary | ICD-10-CM | POA: Diagnosis not present

## 2017-05-01 ENCOUNTER — Encounter (HOSPITAL_COMMUNITY)
Admission: RE | Admit: 2017-05-01 | Discharge: 2017-05-01 | Disposition: A | Discharge status: Home / Self Care | Payer: Medicare Other | Source: Ambulatory Visit | Attending: Cardiology | Admitting: Cardiology

## 2017-05-01 DIAGNOSIS — Z951 Presence of aortocoronary bypass graft: Secondary | ICD-10-CM | POA: Diagnosis not present

## 2017-05-03 ENCOUNTER — Encounter (HOSPITAL_COMMUNITY)
Admission: RE | Admit: 2017-05-03 | Discharge: 2017-05-03 | Disposition: A | Discharge status: Home / Self Care | Payer: Medicare Other | Source: Ambulatory Visit | Attending: Cardiology | Admitting: Cardiology

## 2017-05-03 DIAGNOSIS — Z951 Presence of aortocoronary bypass graft: Secondary | ICD-10-CM | POA: Diagnosis not present

## 2017-05-06 ENCOUNTER — Encounter (HOSPITAL_COMMUNITY)
Admission: RE | Admit: 2017-05-06 | Discharge: 2017-05-06 | Disposition: A | Discharge status: Home / Self Care | Payer: Medicare Other | Source: Ambulatory Visit | Attending: Cardiology | Admitting: Cardiology

## 2017-05-06 DIAGNOSIS — Z951 Presence of aortocoronary bypass graft: Secondary | ICD-10-CM

## 2017-05-07 NOTE — Progress Notes (Signed)
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  aspirin EC 81 MG tablet, Take 81 mg by mouth daily., Disp: , Rfl:  .  atorvastatin (LIPITOR) 80 MG tablet, TAKE 1 TABLET BY MOUTH EVERY DAY AT 6PM, Disp: 90 tablet, Rfl: 3 .  cetirizine (ZYRTEC) 10 MG tablet, Take 10 mg by mouth daily., Disp: , Rfl:  .  Cholecalciferol (VITAMIN D-3) 5000 units TABS, Take 15,000 Units by mouth daily. , Disp: , Rfl:  .  lisinopril (PRINIVIL,ZESTRIL) 2.5 MG tablet, TAKE 1 TABLET BY MOUTH EVERY DAY, Disp: 30 tablet, Rfl: 10 .  metoprolol tartrate (LOPRESSOR) 25 MG tablet, TAKE 1/2 TABLET BY MOUTH TWICE DAILY, Disp: 90 tablet, Rfl: 3 .  Multiple Vitamin (MULTIVITAMIN WITH MINERALS) TABS tablet, Take 1 tablet by mouth daily., Disp: , Rfl:  .  Omega-3 Fatty Acids (HM FISH OIL PO), Take 800 mg by mouth daily., Disp: , Rfl:   Past Medical History: Past Medical History:  Diagnosis Date  . Chest pain in adult 01/09/2017  . Coronary artery disease   . 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 Never Smoker  Smokeless Tobacco  . Never Used    Labs: Recent Review Flowsheet Data    Labs for ITP Cardiac and Pulmonary Rehab Latest Ref Rng & Units 01/11/2017 01/11/2017 01/11/2017 01/12/2017 03/14/2017   Cholestrol 100 - 199 mg/dL - - - - 121   LDLCALC 0 - 99 mg/dL - - - - 65   HDL >39 mg/dL - - - - 36(L)    Trlycerides 0 - 149 mg/dL - - - - 101   Hemoglobin A1c 4.8 - 5.6 % - - - - -   PHART 7.350 - 7.450 7.486(H) 7.329(L) - - -   PCO2ART 32.0 - 48.0 mmHg 26.9(L) 42.9 - - -   HCO3 20.0 - 28.0 mmol/L 20.3 22.6 - - -   TCO2 0 - 100 mmol/L _0 -   ACIDBASEDEF 0.0 - 2.0 mmol/L 2.0 3.0(H) - - -   O2SAT % 97.0 97.0 - - -      Capillary Blood Glucose: Lab Results  Component Value Date   GLUCAP 126 (H) 01/15/2017   GLUCAP 91 01/15/2017   GLUCAP 116 (H) 01/14/2017   GLUCAP 102 (H) 01/14/2017   GLUCAP 121 (H) 01/14/2017     Exercise Target Goals:    Exercise Program Goal: Individual exercise prescription set with THRR, safety & activity barriers. Participant demonstrates ability to understand and report RPE using BORG scale, to self-measure pulse accurately, and to acknowledge the importance of the exercise prescription.  Exercise Prescription Goal: Starting with aerobic activity 30 plus minutes a day, 3 days per week for initial exercise prescription. Provide home exercise prescription and guidelines that participant acknowledges understanding prior to discharge.  Activity Barriers & Risk Stratification:  Activity Barriers & Cardiac Risk Stratification - 02/19/17 1205      Activity Barriers & Cardiac Risk Stratification   Activity Barriers Back Problems;Other (comment)   Comments Right knee scope x2, left knee scope x1. History of low back pain.   Cardiac Risk Stratification High      6 Minute Walk:     6 Minute Walk    Row Name 02/19/17 1203         6 Minute Walk   Phase Initial     Distance 1838 feet     Walk Time 6 minutes     # of Rest Breaks 0     MPH 3.48     METS 4.16     RPE 11     VO2 Peak 14.57     Symptoms Yes (comment)     Comments Participant c/o left knee soreness at end of walk test.     Resting HR 87 bpm     Resting BP 122/81     Max Ex. HR 112 bpm     Max Ex. BP 128/76     2 Minute Post BP 102/60        Oxygen Initial  Assessment:   Oxygen Re-Evaluation:   Oxygen Discharge (Final Oxygen Re-Evaluation):   Initial Exercise Prescription:     Initial Exercise Prescription - 03/25/17 1100      Intensity   THRR 40-80% of Max Heartrate 61-137      Perform Capillary Blood Glucose checks as needed.  Exercise Prescription Changes:      Exercise Prescription Changes    Row Name 03/11/17 1700 03/25/17 1100 04/09/17 1200 04/23/17 1200       Response to Exercise   Blood Pressure (Admit) 110/80 122/70 108/66 102/60    Blood Pressure (Exercise) 124/80 148/82 124/82 130/80    Blood Pressure (Exit) 104/68 100/68 104/70 102/68    Heart Rate (Admit) 85 bpm 89 bpm 96 bpm 76 bpm    Heart Rate (Exercise) 120 bpm 115 bpm 120 bpm 110 bpm    Heart Rate (Exit) 91 bpm 89 bpm 101 bpm 72 bpm    Rating of Perceived Exertion (Exercise) 111 _0 Symptoms none none none none    Comments  - THR increase  61-137 THR increase  61-137 THR increase  61-137    Duration Continue with 30 min of aerobic exercise without signs/symptoms of physical distress. Continue with 30 min of aerobic exercise without signs/symptoms of physical distress. Continue with 30 min of aerobic exercise without signs/symptoms of physical distress. Continue with 30 min of aerobic exercise without signs/symptoms of physical distress.    Intensity THRR unchanged THRR New THRR New THRR New      Progression   Progression Continue to progress workloads to maintain intensity without signs/symptoms of physical distress. Continue to progress workloads to maintain intensity without signs/symptoms of physical distress. Continue to progress workloads to maintain intensity without signs/symptoms of physical distress. Continue to progress workloads to maintain intensity without signs/symptoms of physical distress.    Average METs 3 4.1 4.2 4.5      Resistance Training   Training Prescription Yes Yes Yes Yes    Weight 4lbs 4lbs 5lbs 5lbs    Reps 10-15  10-15 10-15 10-15    Time 10 Minutes 10 Minutes 10 Minutes 10 Minutes      Treadmill   MPH 3 3.2 3.2 3.2    Grade _1 2  Minutes _0 METs 3.71 4.33 4.33 4.33      Bike   Level 1 1._1 Minutes _2 METs 3 3.98 5.09 5.09      NuStep   Level _3 SPM 90 90 90 100    Minutes _4 METs 2.3 2.9 3.1 4      Home Exercise Plan   Plans to continue exercise at  - Home (comment) Home (comment) Home (comment)    Frequency  - Add 2 additional days to program exercise sessions. Add 2 additional days to program exercise sessions. Add 2 additional days to program exercise sessions.    Initial Home Exercises Provided  - 03/04/17 03/04/17 03/04/17       Exercise Comments:      Exercise Comments    Row Name 03/13/17 1015 04/04/17 0754 05/06/17 1650       Exercise Comments Reviewed MET's and goals. Pt is tolerating exercise well; will continue to monitor pt's activity levels and exercise progression Reviewed MET's and goals. Pt is tolerating exercise well; will continue to monitor pt's activity levels and exercise progression Reviewed MET's and goals. Pt is tolerating exercise well; will continue to monitor pt's activity levels and exercise progression        Exercise Goals and Review:      Exercise Goals    Row Name 02/19/17 1213 02/19/17 1214           Exercise Goals   Increase Physical Activity Yes -  increase stamina/energy levels      Intervention Provide advice, education, support and counseling about physical activity/exercise needs.;Develop an individualized exercise prescription for aerobic and resistive training based on initial evaluation findings, risk stratification, comorbidities and participant's personal goals.  -      Expected Outcomes Achievement of increased cardiorespiratory fitness and enhanced flexibility, muscular endurance and strength shown through measurements of functional capacity and personal statement of  participant.  -      Increase Strength and Stamina Yes -  get active- be able to fish/hike ~528f-10miles      Intervention Provide advice, education, support and counseling about physical activity/exercise needs.;Develop an individualized exercise prescription for aerobic and resistive training based on initial evaluation findings, risk stratification, comorbidities and participant's personal goals.  -      Expected Outcomes Achievement of increased cardiorespiratory fitness and enhanced flexibility, muscular endurance and strength shown through measurements of functional capacity and personal statement of participant.  -         Exercise Goals Re-Evaluation :     Exercise Goals Re-Evaluation    Row Name 03/11/17 1707 03/13/17 1019 04/04/17 0753 04/26/17 1513       Exercise Goal Re-Evaluation   Exercise Goals Review Increase Physical Activity;Increase Strenth and Stamina  - Increase Physical Activity;Increase Strenth and Stamina Increase Physical Activity;Increase Strenth and Stamina    Comments Reviewed HEP on 4/16 with student intern. Pt mode of exercise is walking 2x/week in addition coming to cardiac rehab. Discussed activity progression, warning signs and weather precautions. Pt verbalize understanding. Pt stated " able to walk inclines and negogiate stairs better without fatigue and SOB"  Pt is tolerating WL increases very well; Pt is also down in weight. Current weight is 196lbs Pt is back to normal activities, maintaining weightloss and tolerate WL increases very well.     Expected  Outcomes Pt will be compliant with HEP and will increase in aerobic capacity and exercise tolerance Pt will be compliant with HEP and will increase in aerobic capacity and exercise tolerance Pt will continue to improve cardiorespiratory fitness and reach goal weight Pt will continue to improve cardiorespiratory fitness and reach goal weight        Discharge Exercise Prescription (Final Exercise Prescription  Changes):     Exercise Prescription Changes - 04/23/17 1200      Response to Exercise   Blood Pressure (Admit) 102/60   Blood Pressure (Exercise) 130/80   Blood Pressure (Exit) 102/68   Heart Rate (Admit) 76 bpm   Heart Rate (Exercise) 110 bpm   Heart Rate (Exit) 72 bpm   Rating of Perceived Exertion (Exercise) 10   Symptoms none   Comments THR increase  61-137   Duration Continue with 30 min of aerobic exercise without signs/symptoms of physical distress.   Intensity THRR New     Progression   Progression Continue to progress workloads to maintain intensity without signs/symptoms of physical distress.   Average METs 4.5     Resistance Training   Training Prescription Yes   Weight 5lbs   Reps 10-15   Time 10 Minutes     Treadmill   MPH 3.2   Grade 2   Minutes 10   METs 4.33     Bike   Level 2   Minutes 10   METs 5.09     NuStep   Level 4   SPM 100   Minutes 10   METs 4     Home Exercise Plan   Plans to continue exercise at Home (comment)   Frequency Add 2 additional days to program exercise sessions.   Initial Home Exercises Provided 03/04/17      Nutrition:  Target Goals: Understanding of nutrition guidelines, daily intake of sodium <1551m, cholesterol <2059m calories 30% from fat and 7% or less from saturated fats, daily to have 5 or more servings of fruits and vegetables.  Biometrics:     Pre Biometrics - 02/19/17 1207      Pre Biometrics   Height _0  (1.88 m)   Weight 212 lb 4.9 oz (96.3 kg)   Waist Circumference 36.5 inches   Hip Circumference 41.5 inches   Waist to Hip Ratio 0.88 %   BMI (Calculated) 27.3   Triceps Skinfold 18 mm   % Body Fat 25.9 %   Grip Strength 47 kg   Flexibility 0 in   Single Leg Stand 30 seconds       Nutrition Therapy Plan and Nutrition Goals:     Nutrition Therapy & Goals - 03/06/17 1218      Nutrition Therapy   Diet Therapeutic Lifestyle Changes     Personal Nutrition Goals   Nutrition Goal Wt  loss of 1-2 lb/week to a wt loss goal of 6-15 lb at graduation from [facility] and counsel regarding individualized specific dietary modifications aiming towards targeted core components such as weight, hypertension, lipid management, diabetes, heart failure and other comorbidities.   Expected Outcomes Short Term Goal: Understand basic principles of dietary content, such as calories, fat, sodium, cholesterol and nutrients.;Long Term Goal: Adherence to prescribed nutrition plan.      Nutrition Discharge: Nutrition Scores:     Nutrition Assessments - 02/20/17 1410      MEDFICTS Scores   Pre Score 64  Nutrition Goals Re-Evaluation:   Nutrition Goals Re-Evaluation:   Nutrition Goals Discharge (Final Nutrition Goals Re-Evaluation):   Psychosocial: Target Goals: Acknowledge presence or absence of significant depression and/or stress, maximize coping skills, provide positive support system. Participant is able to verbalize types and ability to use techniques and skills needed for reducing stress and depression.  Initial Review & Psychosocial Screening:     Initial Psych Review & Screening - 02/19/17 1503      Initial Review   Current issues with None Identified     Family Dynamics   Good Support System? Yes   Comments Brief psychosocial assessment reveals no issues or concerns      Barriers   Psychosocial barriers to participate in program There are no identifiable barriers or psychosocial needs.      Quality of Life Scores:     Quality of Life - 02/19/17 1134      Quality of Life Scores   Health/Function Pre 22.9 %   Socioeconomic Pre 22.86 %   Psych/Spiritual Pre 22.67 %   Family Pre 22.5 %   GLOBAL Pre 22.79 %      PHQ-9: Recent Review Flowsheet Data    Depression screen [facility] 2/9 02/25/2017   Decreased Interest 0   Down, Depressed, Hopeless 0   PHQ - 2 Score 0     Interpretation of Total Score   Total Score Depression Severity:  1-4 = Minimal depression, 5-9 = Mild depression, 10-14 = Moderate depression, 15-19 = Moderately severe depression, 20-27 = Severe depression   Psychosocial Evaluation and Intervention:     Psychosocial Evaluation - 05/07/17 0006      Psychosocial Evaluation & Interventions   Interventions Stress management education;Encouraged to exercise with the program and follow exercise prescription;Relaxation education   Comments pt is retired and loves bein able to fish when he would like to, good support at home   Continue Psychosocial Services  No Follow up required      Psychosocial Re-Evaluation:     Psychosocial Re-Evaluation    [place] Name 05/07/17 0006             Psychosocial Re-Evaluation   Current issues with None Identified       Interventions Encouraged to attend Cardiac Rehabilitation for the exercise       Continue Psychosocial Services  No Follow up required          Psychosocial Discharge (Final Psychosocial Re-Evaluation):     Psychosocial Re-Evaluation - 05/07/17 0006      Psychosocial Re-Evaluation   Current issues with None Identified   Interventions Encouraged to attend Cardiac Rehabilitation for the exercise   Continue Psychosocial Services  No Follow up required      Vocational Rehabilitation: Provide vocational rehab assistance to qualifying candidates.   Vocational Rehab Evaluation & Intervention:     Vocational Rehab - 02/19/17 1503      Initial Vocational Rehab Evaluation & Intervention   Assessment shows need for Vocational Rehabilitation No  Retired from [employer]      Education: Education Goals: Education classes will be provided on a weekly basis, covering required topics. Participant will state understanding/return demonstration of topics presented.  Learning Barriers/Preferences:     Learning Barriers/Preferences - 02/19/17 1213      Learning Barriers/Preferences    Learning Barriers Hearing;Sight   Learning Preferences Video;Pictoral;Computer/Internet      Education Topics: Count Your Pulse:  -Group instruction provided by verbal instruction, demonstration, patient participation  and written materials to support subject.  Instructors address importance of being able to find your pulse and how to count your pulse when at home without a heart monitor.  Patients get hands on experience counting their pulse with staff help and individually.   CARDIAC REHAB PHASE II EXERCISE from 05/03/2017 in [place]  Date  04/19/17  Instruction Review Code  2- meets goals/outcomes      Heart Attack, Angina, and Risk Factor Modification:  -Group instruction provided by verbal instruction, video, and written materials to support subject.  Instructors address signs and symptoms of angina and heart attacks.    Also discuss risk factors for heart disease and how to make changes to improve heart health risk factors.   CARDIAC REHAB PHASE II EXERCISE from 05/03/2017 in [place]  Date  04/10/17  Instruction Review Code  2- meets goals/outcomes      Functional Fitness:  -Group instruction provided by verbal instruction, demonstration, patient participation, and written materials to support subject.  Instructors address safety measures for doing things around the house.  Discuss how to get up and down off the floor, how to pick things up properly, how to safely get out of a chair without assistance, and balance training.   CARDIAC REHAB PHASE II EXERCISE from 05/03/2017 in [place]  Date  05/03/17  Instruction Review Code  2- meets goals/outcomes      Meditation and Mindfulness:  -Group instruction provided by verbal instruction, patient participation, and written materials to support subject.  Instructor addresses importance of mindfulness and meditation practice to help reduce  stress and improve awareness.  Instructor also leads participants through a meditation exercise.    CARDIAC REHAB PHASE II EXERCISE from 05/03/2017 in [place]  Date  04/24/17  Instruction Review Code  2- meets goals/outcomes      Stretching for Flexibility and Mobility:  -Group instruction provided by verbal instruction, patient participation, and written materials to support subject.  Instructors lead participants through series of stretches that are designed to increase flexibility thus improving mobility.  These stretches are additional exercise for major muscle groups that are typically performed during regular warm up and cool down.   CARDIAC REHAB PHASE II EXERCISE from 05/03/2017 in [place]  Date  04/12/17  Educator  [place]  Instruction Review Code  2- meets goals/outcomes      Hands Only CPR:  -Group verbal, video, and participation provides a basic overview of AHA guidelines for community CPR. Role-play of emergencies allow participants the opportunity to practice calling for help and chest compression technique with discussion of AED use.   Hypertension: -Group verbal and written instruction that provides a basic overview of hypertension including the most recent diagnostic guidelines, risk factor reduction with self-care instructions and medication management.   CARDIAC REHAB PHASE II EXERCISE from 05/03/2017 in [place]  Date  04/26/17  Instruction Review Code  2- meets goals/outcomes       Nutrition I class: Heart Healthy Eating:  -Group instruction provided by PowerPoint slides, verbal discussion, and written materials to support subject matter. The instructor gives an explanation and review of the Therapeutic Lifestyle Changes diet recommendations, which includes a discussion on lipid goals, dietary fat, sodium, fiber, plant stanol/sterol esters, sugar, and the  components of a well-balanced, healthy diet.   CARDIAC REHAB PHASE II EXERCISE  from 05/03/2017 in [place]  Date  03/19/17  Educator  RD  Instruction Review Code  2- meets goals/outcomes      Nutrition II class: Lifestyle Skills:  -Group instruction provided by PowerPoint slides, verbal discussion, and written materials to support subject matter. The instructor gives an explanation and review of label reading, grocery shopping for heart health, heart healthy recipe modifications, and ways to make healthier choices when eating out.   Diabetes Question & Answer:  -Group instruction provided by PowerPoint slides, verbal discussion, and written materials to support subject matter. The instructor gives an explanation and review of diabetes co-morbidities, pre- and post-prandial blood glucose goals, pre-exercise blood glucose goals, signs, symptoms, and treatment of hypoglycemia and hyperglycemia, and foot care basics.   CARDIAC REHAB PHASE II EXERCISE from 05/03/2017 in [place]  Date  03/29/17  Educator  RD  Instruction Review Code  2- meets goals/outcomes      Diabetes Blitz:  -Group instruction provided by PowerPoint slides, verbal discussion, and written materials to support subject matter. The instructor gives an explanation and review of the physiology behind type 1 and type 2 diabetes, diabetes medications and rational behind using different medications, pre- and post-prandial blood glucose recommendations and Hemoglobin A1c goals, diabetes diet, and exercise including blood glucose guidelines for exercising safely.    Portion Distortion:  -Group instruction provided by PowerPoint slides, verbal discussion, written materials, and food models to support subject matter. The instructor gives an explanation of serving size versus portion size, changes in portions sizes over the last 20 years, and what consists of a serving from  each food group.   CARDIAC REHAB PHASE II EXERCISE from 05/03/2017 in [place]  Date  03/13/17  Educator  RD  Instruction Review Code  2- meets goals/outcomes      Stress Management:  -Group instruction provided by verbal instruction, video, and written materials to support subject matter.  Instructors review role of stress in heart disease and how to cope with stress positively.     CARDIAC REHAB PHASE II EXERCISE from 05/03/2017 in [place]  Date  03/20/17  Instruction Review Code  2- meets goals/outcomes      Exercising on Your Own:  -Group instruction provided by verbal instruction, power point, and written materials to support subject.  Instructors discuss benefits of exercise, components of exercise, frequency and intensity of exercise, and end points for exercise.  Also discuss use of nitroglycerin and activating EMS.  Review options of places to exercise outside of rehab.  Review guidelines for sex with heart disease.   CARDIAC REHAB PHASE II EXERCISE from 05/03/2017 in [place]  Date  04/03/17  Instruction Review Code  2- meets goals/outcomes      Cardiac Drugs I:  -Group instruction provided by verbal instruction and written materials to support subject.  Instructor reviews cardiac drug classes: antiplatelets, anticoagulants, beta blockers, and statins.  Instructor discusses reasons, side effects, and lifestyle considerations for each drug class.   CARDIAC REHAB PHASE II EXERCISE from 05/03/2017 in [place]  Date  02/27/17  Instruction Review Code  2- meets goals/outcomes      Cardiac Drugs II:  -Group instruction provided by verbal instruction and written materials to support subject.  Instructor reviews cardiac drug classes: angiotensin converting enzyme inhibitors (ACE-I), angiotensin II receptor blockers (ARBs), nitrates, and calcium  channel blockers.  Instructor discusses reasons, side effects, and lifestyle considerations for each drug class.   CARDIAC REHAB PHASE II EXERCISE from 05/03/2017 in [place]  Date  03/27/17  Instruction Review Code  2- meets goals/outcomes      Anatomy and Physiology of the Circulatory System:  Group verbal and written instruction and models provide basic cardiac anatomy and physiology, with the coronary electrical and arterial systems. Review of: AMI, Angina, Valve disease, Heart Failure, Peripheral Artery Disease, Cardiac Arrhythmia, Pacemakers, and the ICD.   Other Education:  -Group or individual verbal, written, or video instructions that support the educational goals of the cardiac rehab program.   Knowledge Questionnaire Score:     Knowledge Questionnaire Score - 02/19/17 1134      Knowledge Questionnaire Score   Pre Score 22/24      Core Components/Risk Factors/Patient Goals at Admission:     Personal Goals and Risk Factors at Admission - 03/06/17 1218      Core Components/Risk Factors/Patient Goals on Admission    Weight Management Yes;Weight Loss   Intervention Weight Management: Develop a combined nutrition and exercise program designed to reach desired caloric intake, while maintaining appropriate intake of nutrient and fiber, sodium and fats, and appropriate energy expenditure required for the weight goal.;Weight Management: Provide education and appropriate resources to help participant work on and attain dietary goals.   Admit Weight 212 lb (96.2 kg)   Goal Weight: Short Term 206 lb (93.4 kg)   Goal Weight: Long Term 197 lb (89.4 kg)   Expected Outcomes Short Term: Continue to assess and modify interventions until short term weight is achieved;Long Term: Adherence to nutrition and physical activity/exercise program aimed toward attainment of established weight goal;Weight Loss: Understanding of general recommendations for a balanced  deficit meal plan, which promotes 1-2 lb weight loss per week and includes a negative energy balance of [phone] kcal/d;Understanding of distribution of calorie intake throughout the day with the consumption of 4-5 meals/snacks   Hypertension Yes   Intervention Provide education on lifestyle modifcations including regular physical activity/exercise, weight management, moderate sodium restriction and increased consumption of fresh fruit, vegetables, and low fat dairy, alcohol moderation, and smoking cessation.;Monitor prescription use compliance.   Expected Outcomes Short Term: Continued assessment and intervention until BP is < 140/53m HG in hypertensive participants. < 130/85mHG in hypertensive participants with diabetes, heart failure or chronic kidney disease.;Long Term: Maintenance of blood pressure at goal levels.   Lipids Yes   Intervention Provide education and support for participant on nutrition & aerobic/resistive exercise along with prescribed medications to achieve LDL <7066mHDL >81m64m Expected Outcomes Short Term: Participant states understanding of desired cholesterol values and is compliant with medications prescribed. Participant is following exercise prescription and nutrition guidelines.;Long Term: Cholesterol controlled with medications as prescribed, with individualized exercise RX and with personalized nutrition plan. Value goals: LDL < 70mg27mL > 40 mg.      Core Components/Risk Factors/Patient Goals Review:      Goals and Risk Factor Review    Row Name 04/09/17 1620 05/07/17 0005           Core Components/Risk Factors/Patient Goals Review   Personal Goals Review Weight Management/Obesity;Lipids;Hypertension Weight Management/Obesity;Lipids;Hypertension      Review Pt lost about 7 pounds since beginning his participation in CR, blood pressure remain wnl, no recent lipid panel Pt is doing well  since beginning his participation in CR with weight loss, blood pressure  remain wnl, no recent lipid panel      Expected Outcomes  - Pt will maintain his weight loss, lipid values wnl and normal bp readings.         Core Components/Risk Factors/Patient Goals at Discharge (Final Review):      Goals and Risk Factor Review - 05/07/17 0005      Core Components/Risk Factors/Patient Goals Review   Personal Goals Review Weight Management/Obesity;Lipids;Hypertension   Review Pt is doing well  since beginning his participation in CR with weight loss, blood pressure remain wnl, no recent lipid panel   Expected Outcomes Pt will maintain his weight loss, lipid values wnl and normal bp readings.      ITP Comments:     ITP Comments    Row Name 02/19/17 1006 03/01/17 1112         ITP Comments Dr. Fransico Him, Medical Director  Patient attended HTN class on 03/01/2017         Comments:  Pt is making expected progress toward personal goals after completing 31 sessions and will graduate next week .Psychosocial Assessment Pt has a positive outlook and healthy coping skills. Pt is positive with staff and fellow participants. Pt has good support from his wife.Recommend continued exercise and life style modification education including  stress management and relaxation techniques to decrease cardiac risk profile. Cherre Huger, BSN Cardiac and Training and development officer

## 2017-05-08 ENCOUNTER — Encounter (HOSPITAL_COMMUNITY)
Admission: RE | Admit: 2017-05-08 | Discharge: 2017-05-08 | Disposition: A | Discharge status: Home / Self Care | Payer: Medicare Other | Source: Ambulatory Visit | Attending: Cardiology | Admitting: Cardiology

## 2017-05-08 DIAGNOSIS — Z951 Presence of aortocoronary bypass graft: Secondary | ICD-10-CM

## 2017-05-10 ENCOUNTER — Encounter (HOSPITAL_COMMUNITY)
Admission: RE | Admit: 2017-05-10 | Discharge: 2017-05-10 | Disposition: A | Discharge status: Home / Self Care | Payer: Medicare Other | Source: Ambulatory Visit | Attending: Cardiology | Admitting: Cardiology

## 2017-05-10 DIAGNOSIS — Z951 Presence of aortocoronary bypass graft: Secondary | ICD-10-CM

## 2017-05-10 NOTE — Progress Notes (Signed)
Discharge Summary  Patient Details  Name: [name] MRN: [id number] Date of Birth: 1951/04/17 Referring Provider:     [place] from 02/19/2017 in [place]  Referring Provider  Candee Furbish MD       Number of Visits: 36  Reason for Discharge:  Patient reached a stable level of exercise. Patient independent in their exercise.  Smoking History:  History  Smoking Status  . Never Smoker  Smokeless Tobacco  . Never Used    Diagnosis:  S/P CABG (coronary artery bypass graft)  ADL [facility]:   Initial Exercise Prescription:     Initial Exercise Prescription - 03/25/17 1100      Intensity   THRR 40-80% of Max Heartrate 61-137      Discharge Exercise Prescription (Final Exercise Prescription Changes):     Exercise Prescription Changes - 05/17/17 1113      Response to Exercise   Blood Pressure (Admit) 106/72   Blood Pressure (Exercise) 148/70   Blood Pressure (Exit) 100/64   Heart Rate (Admit) 85 bpm   Heart Rate (Exercise) 129 bpm   Heart Rate (Exit) 102 bpm   Rating of Perceived Exertion (Exercise) 11   Symptoms none   Comments THR increase (61-137)   Duration Continue with 30 min of aerobic exercise without signs/symptoms of physical distress.   Intensity THRR unchanged     Progression   Progression Continue to progress workloads to maintain intensity without signs/symptoms of physical distress.   Average METs 5     Resistance Training   Training Prescription Yes   Weight 10lbs   Reps 10-15   Time 10 Minutes     Treadmill   MPH 3.2   Grade 2   Minutes 10   METs 4.33     Bike   Level 2   Minutes 10   METs 5.18     NuStep   Level 5   SPM 100   Minutes 10   METs 5.5     Home Exercise Plan   Plans to continue exercise at Home (comment)   Frequency Add 2 additional days to program exercise sessions.   Initial Home Exercises Provided 03/04/17      Functional Capacity:      6 Minute Walk    Row Name 02/19/17 1203 06/05/17 1117       6 Minute Walk   Phase Initial Discharge    Distance 1838 feet 1870 feet    Distance % Change  - 1.74 %    Walk Time 6 minutes 6 minutes    # of Rest Breaks 0 0    MPH 3.48 3.48    METS 4.16 4.34    RPE 11 11    VO2 Peak 14.57 15.19    Symptoms Yes (comment) No    Comments Participant c/o left knee soreness at end of walk test.  -    Resting HR 87 bpm 76 bpm    Resting BP 122/81 118/62    Max Ex. HR 112 bpm 114 bpm    Max Ex. BP 128/76 136/74    2 Minute Post BP 102/60 120/72       Psychological, QOL, Others - Outcomes: PHQ 2/9: Depression screen [facility] 2/9 05/10/2017 02/25/2017  Decreased Interest 0 0  Down, Depressed, Hopeless 0 0  PHQ - 2 Score 0 0    Quality of Life:     Quality of Life - 05/10/17 1550  Quality of Life Scores   Health/Function Pre 22.9 %   Health/Function Post 26.97 %   Health/Function % Change 17.77 %   Socioeconomic Pre 22.86 %   Socioeconomic Post 24.64 %   Socioeconomic % Change  7.79 %   Psych/Spiritual Pre 22.67 %   Psych/Spiritual Post 26.8 %   Psych/Spiritual % Change 18.22 %   Family Pre 22.5 %   Family Post 19.8 %   Family % Change -12 %   GLOBAL Pre 22.79 %   GLOBAL Post 25.31 %   GLOBAL % Change 11.06 %      Personal Goals: Goals established at orientation with interventions provided to work toward goal.     Personal Goals and Risk Factors at Admission - 03/06/17 1218      Core Components/Risk Factors/Patient Goals on Admission    Weight Management Yes;Weight Loss   Intervention Weight Management: Develop a combined nutrition and exercise program designed to reach desired caloric intake, while maintaining appropriate intake of nutrient and fiber, sodium and fats, and appropriate energy expenditure required for the weight goal.;Weight Management: Provide education and appropriate resources to help participant work on and attain dietary goals.   Admit Weight 212  lb (96.2 kg)   Goal Weight: Short Term 206 lb (93.4 kg)   Goal Weight: Long Term 197 lb (89.4 kg)   Expected Outcomes Short Term: Continue to assess and modify interventions until short term weight is achieved;Long Term: Adherence to nutrition and physical activity/exercise program aimed toward attainment of established weight goal;Weight Loss: Understanding of general recommendations for a balanced deficit meal plan, which promotes 1-2 lb weight loss per week and includes a negative energy balance of [phone] kcal/d;Understanding of distribution of calorie intake throughout the day with the consumption of 4-5 meals/snacks   Hypertension Yes   Intervention Provide education on lifestyle modifcations including regular physical activity/exercise, weight management, moderate sodium restriction and increased consumption of fresh fruit, vegetables, and low fat dairy, alcohol moderation, and smoking cessation.;Monitor prescription use compliance.   Expected Outcomes Short Term: Continued assessment and intervention until BP is < 140/65m HG in hypertensive participants. < 130/867mHG in hypertensive participants with diabetes, heart failure or chronic kidney disease.;Long Term: Maintenance of blood pressure at goal levels.   Lipids Yes   Intervention Provide education and support for participant on nutrition & aerobic/resistive exercise along with prescribed medications to achieve LDL <7075mHDL >78m78m Expected Outcomes Short Term: Participant states understanding of desired cholesterol values and is compliant with medications prescribed. Participant is following exercise prescription and nutrition guidelines.;Long Term: Cholesterol controlled with medications as prescribed, with individualized exercise RX and with personalized nutrition plan. Value goals: LDL < 70mg22mL > 40 mg.       Personal Goals Discharge:     Goals and Risk Factor Review    Row Name 04/09/17 1620 05/07/17 0005           Core  Components/Risk Factors/Patient Goals Review   Personal Goals Review Weight Management/Obesity;Lipids;Hypertension Weight Management/Obesity;Lipids;Hypertension      Review Pt lost about 7 pounds since beginning his participation in CR, blood pressure remain wnl, no recent lipid panel Pt is doing well  since beginning his participation in CR with weight loss, blood pressure remain wnl, no recent lipid panel      Expected Outcomes  - Pt will maintain his weight loss, lipid values wnl and normal bp readings.         Nutrition & Weight -  Outcomes:     Pre Biometrics - 02/19/17 1207      Pre Biometrics   Height 6' 2" (1.88 m)   Weight 212 lb 4.9 oz (96.3 kg)   Waist Circumference 36.5 inches   Hip Circumference 41.5 inches   Waist to Hip Ratio 0.88 %   BMI (Calculated) 27.3   Triceps Skinfold 18 mm   % Body Fat 25.9 %   Grip Strength 47 kg   Flexibility 0 in   Single Leg Stand 30 seconds         Post Biometrics - 06/05/17 1116       Post  Biometrics   Height 6' 2" (1.88 m)   Weight 202 lb 2.6 oz (91.7 kg)   Waist Circumference 36.5 inches   Hip Circumference 41 inches   Waist to Hip Ratio 0.89 %   BMI (Calculated) 26   Triceps Skinfold 13 mm   % Body Fat 24.1 %   Grip Strength 41 kg   Flexibility 7.5 in   Single Leg Stand 30 seconds      Nutrition:     Nutrition Therapy & Goals - 03/06/17 1218      Nutrition Therapy   Diet Therapeutic Lifestyle Changes     Personal Nutrition Goals   Nutrition Goal Wt loss of 1-2 lb/week to a wt loss goal of 6-15 lb at graduation from [place], educate and counsel regarding individualized specific dietary modifications aiming towards targeted core components such as weight, hypertension, lipid management, diabetes, heart failure and other comorbidities.   Expected Outcomes Short Term Goal: Understand basic principles of dietary content, such as calories, fat, sodium, cholesterol  and nutrients.;Long Term Goal: Adherence to prescribed nutrition plan.      Nutrition Discharge:     Nutrition Assessments - 02/20/17 1410      MEDFICTS Scores   Pre Score 64      Education Questionnaire Score:     Knowledge Questionnaire Score - 05/10/17 1550      Knowledge Questionnaire Score   Post Score 21/24      Goals reviewed with patient. Pt graduated from cardiac rehab program today with completion of 36 exercise sessions in Phase II. Pt maintained good attendance and progressed nicely during his participation in rehab as evidenced by increased MET level.   Medication list reconciled. Repeat  PHQ score-0.  Pt completed post quality of life survey.  Pt scored the following     Quality of Life - 05/10/17 1550      Quality of Life Scores   Health/Function Pre 22.9 %   Health/Function Post 26.97 %   Health/Function % Change 17.77 %   Socioeconomic Pre 22.86 %   Socioeconomic Post 24.64 %   Socioeconomic % Change  7.79 %   Psych/Spiritual Pre 22.67 %   Psych/Spiritual Post 26.8 %   Psych/Spiritual % Change 18.22 %   Family Pre 22.5 %   Family Post 19.8 %   Family % Change -12 %   GLOBAL Pre 22.79 %   GLOBAL Post 25.31 %   GLOBAL % Change 11.06 %       Pt has made significant lifestyle changes and should be commended for his success. Pt feels he has achieved his goals during cardiac rehab. Pt has increased energy level and has gotten back to previous activities without angina.  Pt will leave for [place] where he plans to hike and  fish and feels he will be able to keep up with his fishing buddies.  Pt plans to continue exercise in cardiac maintenance program for the month of July on his off days he will continue to walk.  Pt is looking forward to returning to [place] where he will be able to hike and fish.  Pt states that he feels better than he has ever felt. It was a joy to have his pt participate in cardiac rehab. Cherre Huger, BSN Cardiac and  Training and development officer

## 2017-05-13 ENCOUNTER — Encounter (HOSPITAL_COMMUNITY)
Admission: RE | Admit: 2017-05-13 | Discharge: 2017-05-13 | Disposition: A | Discharge status: Home / Self Care | Payer: Medicare Other | Source: Ambulatory Visit | Attending: Cardiology | Admitting: Cardiology

## 2017-05-13 DIAGNOSIS — Z951 Presence of aortocoronary bypass graft: Secondary | ICD-10-CM | POA: Diagnosis not present

## 2017-05-15 ENCOUNTER — Encounter (HOSPITAL_COMMUNITY)
Admission: RE | Admit: 2017-05-15 | Discharge: 2017-05-15 | Disposition: A | Discharge status: Home / Self Care | Payer: Medicare Other | Source: Ambulatory Visit | Attending: Cardiology | Admitting: Cardiology

## 2017-05-15 DIAGNOSIS — Z951 Presence of aortocoronary bypass graft: Secondary | ICD-10-CM

## 2017-05-17 ENCOUNTER — Encounter (HOSPITAL_COMMUNITY)
Admission: RE | Admit: 2017-05-17 | Discharge: 2017-05-17 | Disposition: A | Discharge status: Home / Self Care | Payer: Medicare Other | Source: Ambulatory Visit | Attending: Cardiology | Admitting: Cardiology

## 2017-05-17 VITALS — Ht 74.0 in | Wt 202.2 lb

## 2017-05-17 DIAGNOSIS — Z951 Presence of aortocoronary bypass graft: Secondary | ICD-10-CM | POA: Diagnosis not present

## 2017-05-20 ENCOUNTER — Encounter (HOSPITAL_COMMUNITY): Payer: Medicare Other

## 2017-06-05 NOTE — Addendum Note (Signed)
Encounter addended by: Warrick ParisianFair, Ayme Short D on: 06/05/2017 11:23 AM<BR>    Actions taken: Flowsheet accepted, Flowsheet data copied forward, Visit Navigator Flowsheet section accepted

## 2017-06-08 ENCOUNTER — Encounter (HOSPITAL_COMMUNITY): Payer: Self-pay | Admitting: *Deleted

## 2017-06-21 NOTE — Addendum Note (Signed)
Encounter addended by: Jacques EarthlyFranko, Yahira Timberman Brewbaker, RD on: 06/21/2017  9:31 AM<BR>    Actions taken: Flowsheet data copied forward, Visit Navigator Flowsheet section accepted

## 2017-07-10 ENCOUNTER — Telehealth: Payer: Self-pay | Admitting: Cardiology

## 2017-07-10 NOTE — Telephone Encounter (Signed)
Will forward to Dr Skains for review ./cy 

## 2017-07-10 NOTE — Telephone Encounter (Signed)
New Message     1. What dental office are you calling from?  [facility]  2. What is your office phone and fax number?  Fax [phone] ofc [phone]  3. What type of procedure is the patient having performed?  Cleaning   What date is procedure scheduled? 07/16/17 4. What is your question (ex. Antibiotics prior to procedure, holding medication-we need to know how long dentist wants pt to hold med)? Does pt need premeds since he had heart surgery in February

## 2017-07-11 NOTE — Telephone Encounter (Signed)
No. Thanks Donato Schultz, MD

## 2017-07-12 NOTE — Telephone Encounter (Signed)
fAXED THIS NOTE TO DENTIST OFFICE .Zack Seal

## 2017-07-16 ENCOUNTER — Telehealth: Payer: Self-pay | Admitting: Cardiology

## 2017-07-16 NOTE — Telephone Encounter (Signed)
Will refax phone note from 07/10/17.

## 2017-07-16 NOTE — Telephone Encounter (Signed)
[place] calling with [facility], calling back for medical clearance. States that she has not received fax sent on 07-12-17.

## 2017-10-18 ENCOUNTER — Other Ambulatory Visit: Payer: Self-pay | Admitting: Cardiology

## 2017-10-20 ENCOUNTER — Other Ambulatory Visit: Payer: Self-pay | Admitting: Cardiology

## 2017-12-13 ENCOUNTER — Telehealth: Payer: Self-pay | Admitting: *Deleted

## 2017-12-13 NOTE — Telephone Encounter (Signed)
Reviewed with Dr Anne FuSkains.  He does not know any of the MDs listed in [place].  Pt should est and have them request the records once we know where to send them to.

## 2017-12-13 NOTE — Telephone Encounter (Signed)
-----   Message from Marcelle SmilingKimberly D Mesiemore sent at 12/11/2017  3:45 PM EST ----- Contact: [phone] Aram BeechamHey, Pam   Patient called asking for a referral to [place] Cardiologist [place]. To one of the 3 doctors in the practice. [facility]  [facility]  [address] [place], [place] [id number]  # [phone] Patient also stated he only has 2 months left on his medications.   Thanks, Selena BattenKim

## 2017-12-13 NOTE — Telephone Encounter (Signed)
Spoke with patient who reports Cardiology office in [place] is requiring a referral before they will establish pt to their care.  Pt aware I will contact then on Monday.

## 2017-12-16 NOTE — Telephone Encounter (Signed)
Spoke with Josh at [facility].  He will fax a form to be completed and request pt's records in order to establish.

## 2017-12-19 NOTE — Telephone Encounter (Signed)
I have not received the fax from [facility].  Called back and left message to please re-fax to my attention.

## 2017-12-19 NOTE — Telephone Encounter (Signed)
Referral paperwork received.  Will be completed and faxed back as instructed.

## 2017-12-19 NOTE — Telephone Encounter (Signed)
Called back and spoke with Josh.  He is going to re-fax the referral request.

## 2017-12-19 NOTE — Telephone Encounter (Signed)
Follow up   Marissa from [facility] is returning call in reference to voicemail that she received. She states the patient is a patient of [facility] and there contact number is [phone] option 2.

## 2017-12-19 NOTE — Telephone Encounter (Signed)
Pt aware information for referral received from [facility] - referral and records faxed as verbally requested by patient.

## 2018-01-29 ENCOUNTER — Other Ambulatory Visit: Payer: Self-pay | Admitting: Cardiology

## 2018-01-29 MED ORDER — LISINOPRIL 2.5 MG PO TABS: ORAL_TABLET | ORAL | 0 refills | Status: AC

## 2018-01-29 NOTE — Telephone Encounter (Signed)
Pt' medication was sent to pt's pharmacy as requested. Confirmation received.  

## 2018-02-26 ENCOUNTER — Other Ambulatory Visit: Payer: Self-pay | Admitting: Cardiology

## 2018-02-28 ENCOUNTER — Other Ambulatory Visit: Payer: Self-pay | Admitting: Cardiology

## 2018-03-28 IMAGING — DX DG CHEST 2V
2 series · 2 of 2 positions shown · non-contrast
Comparison: None.

CLINICAL DATA: Shortness of breath for 3 weeks. Midsternal chest
pain earlier today with left arm pain and tightness.

EXAM:
CHEST  2 VIEW

[chest pa]
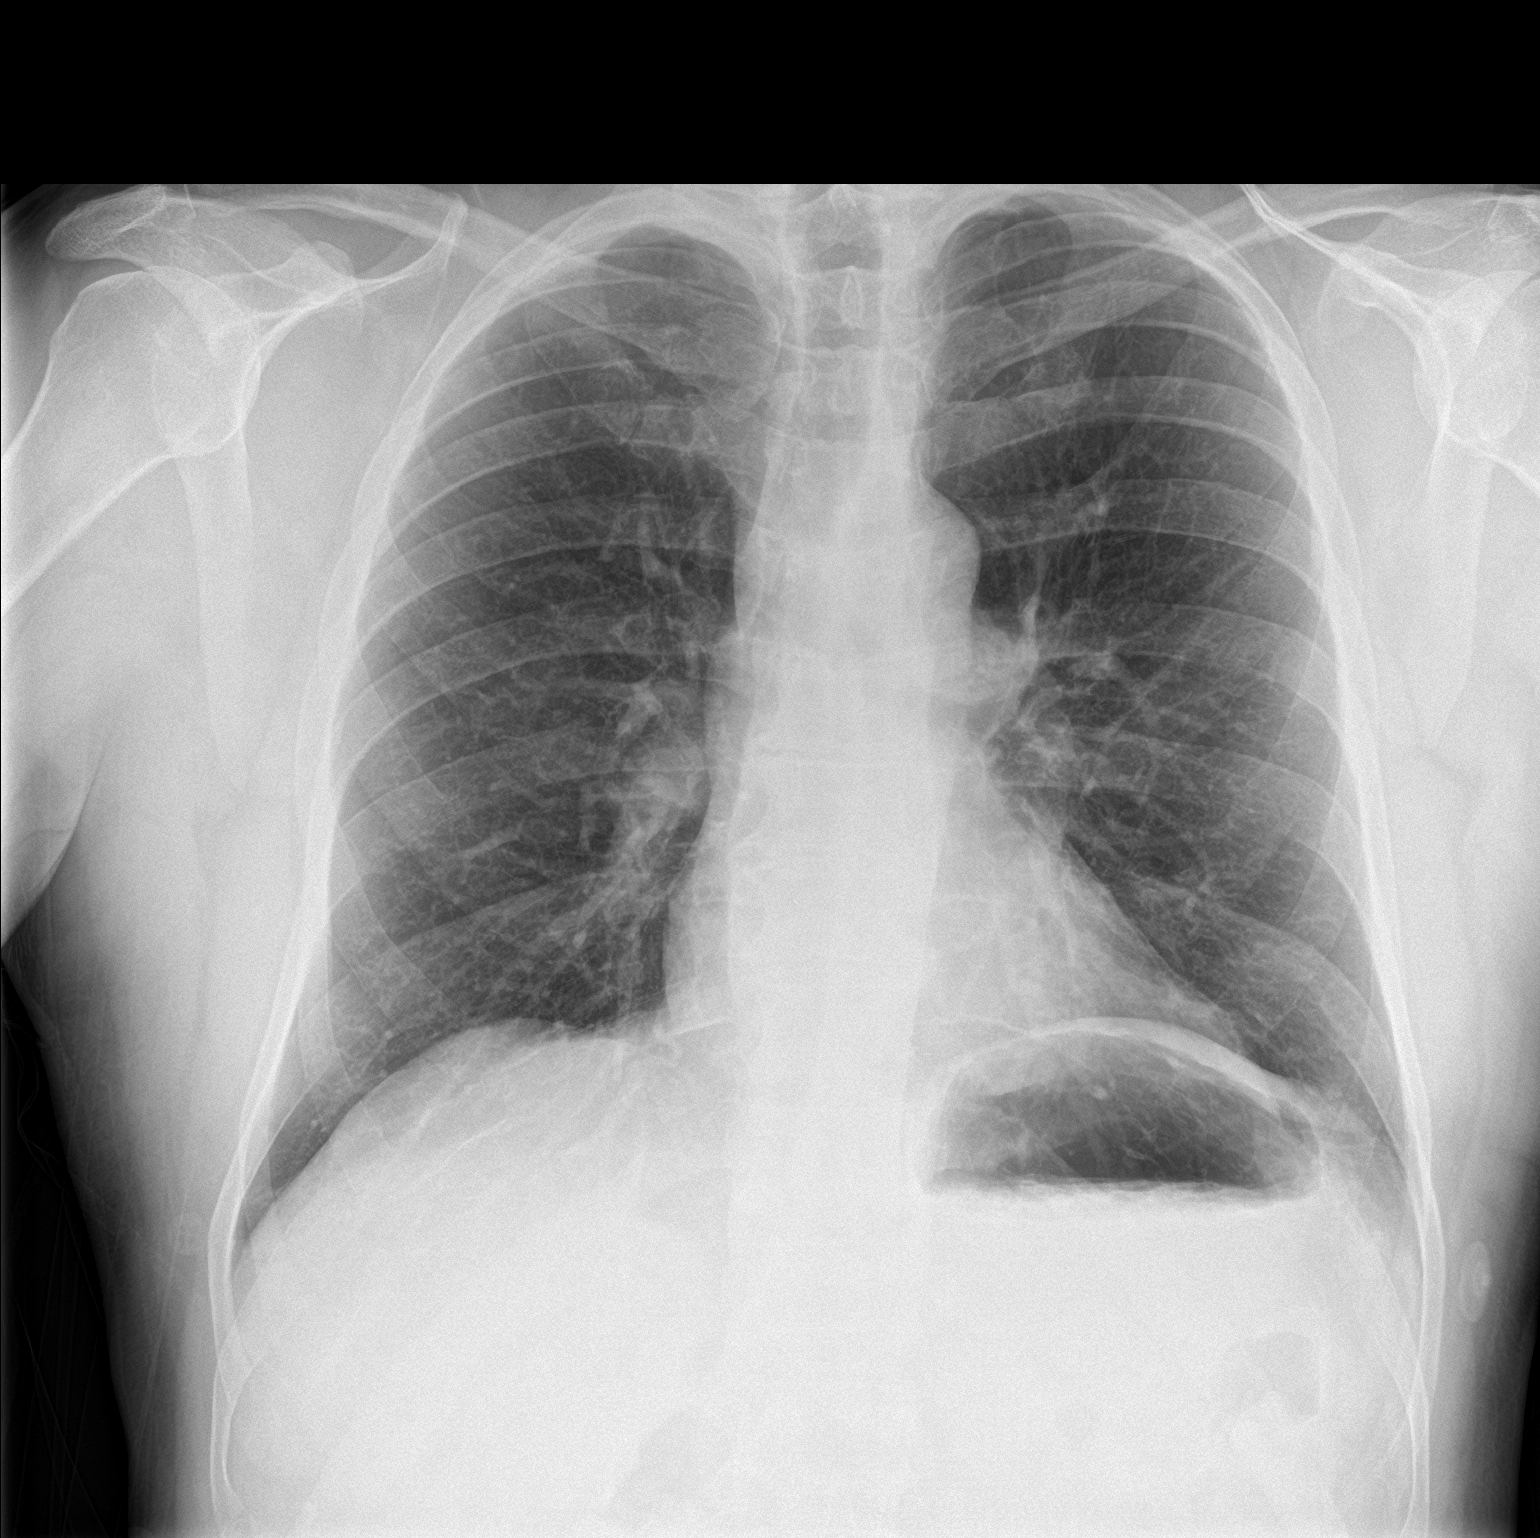

[chest lat]
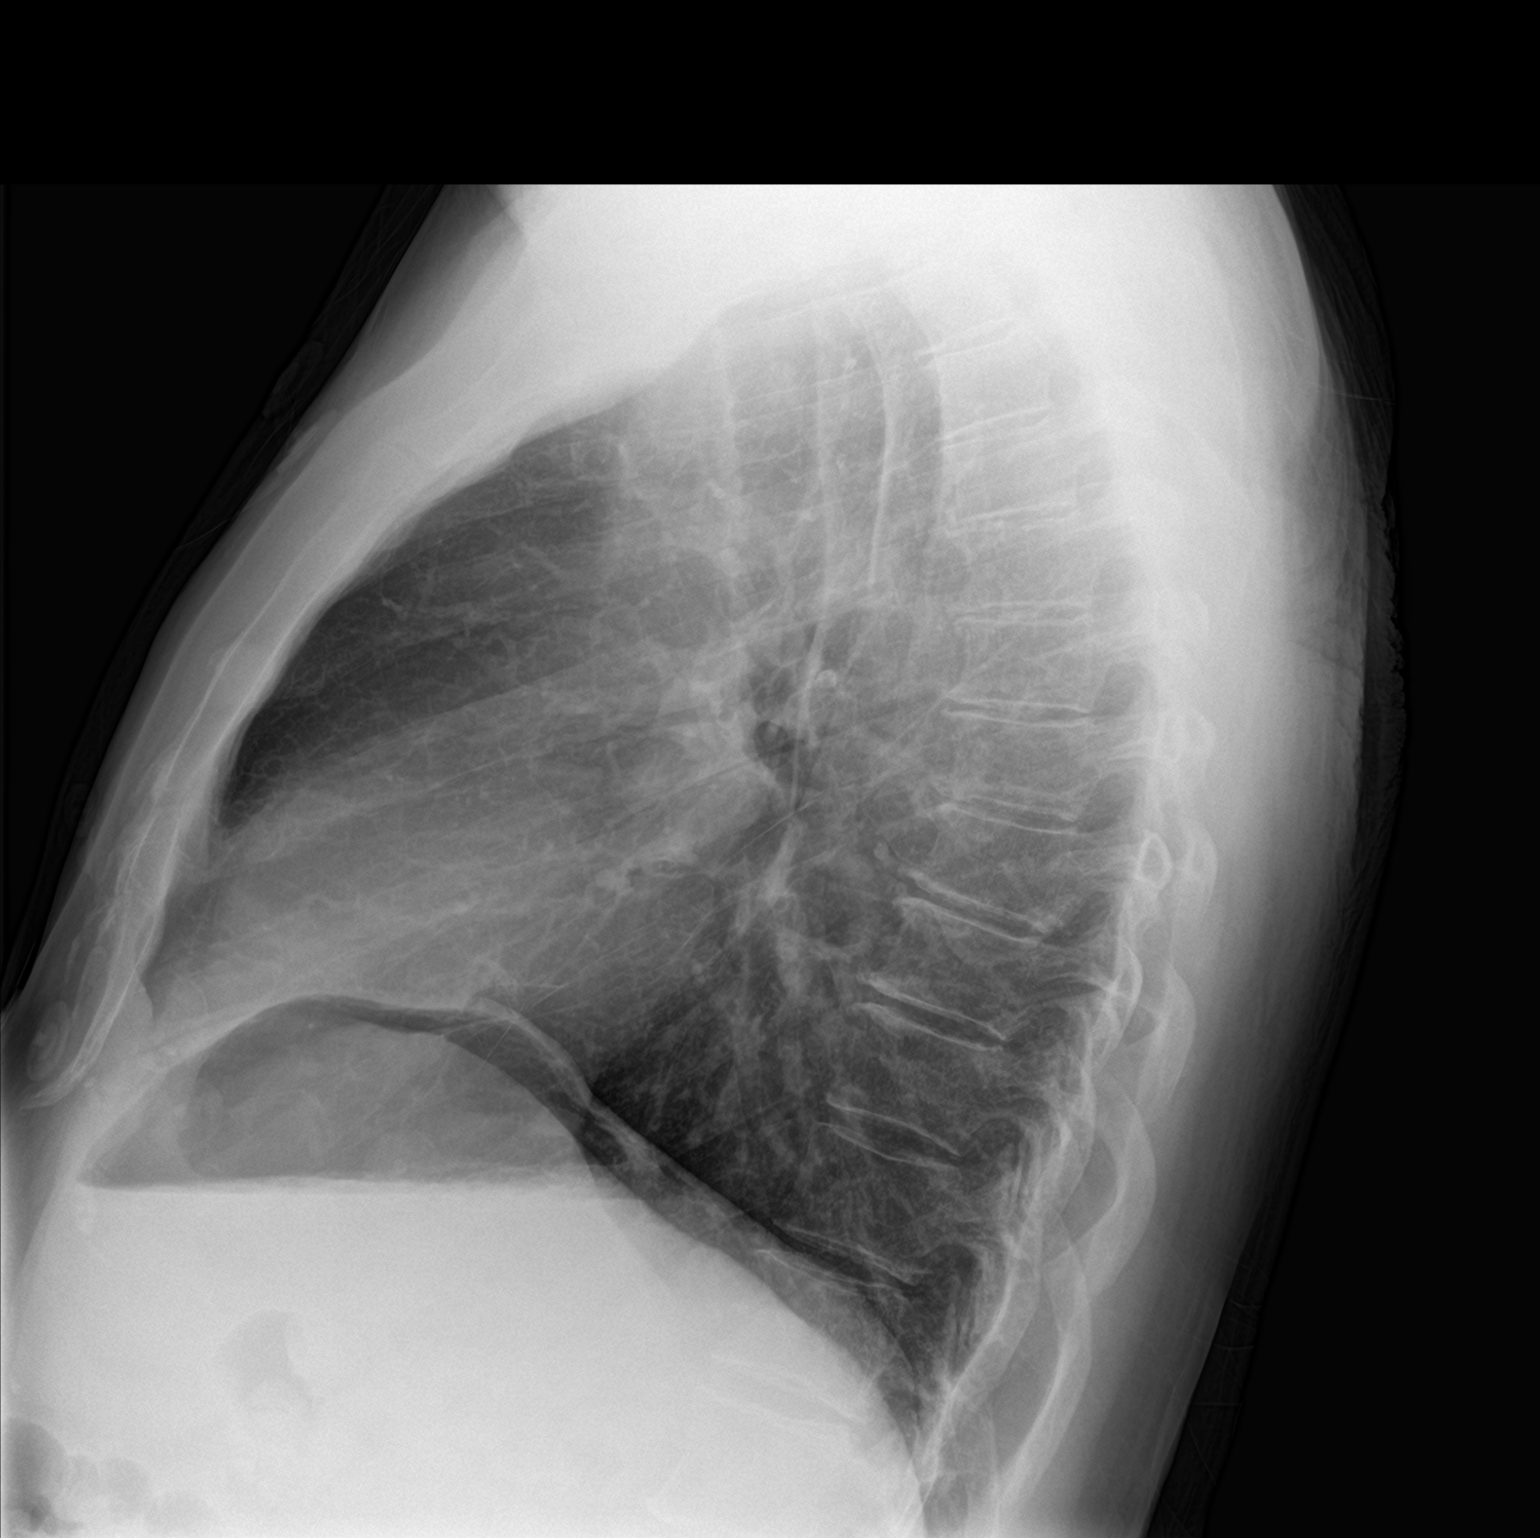

[2 of 2 positions shown; findings below may reference images not displayed]

FINDINGS: The heart size and mediastinal contours are normal. The lungs are
clear. There is no pleural effusion or pneumothorax. No acute
osseous findings are identified. EKG snaps overlie the chest.
IMPRESSION: No active cardiopulmonary process.

## 2018-04-03 IMAGING — DX DG CHEST 1V PORT
1 series · 1 of 1 positions shown · non-contrast
Comparison: PA and lateral chest 01/05/2017.

CLINICAL DATA: Status post CABG today.

EXAM:
PORTABLE CHEST 1 VIEW

[chest ap]
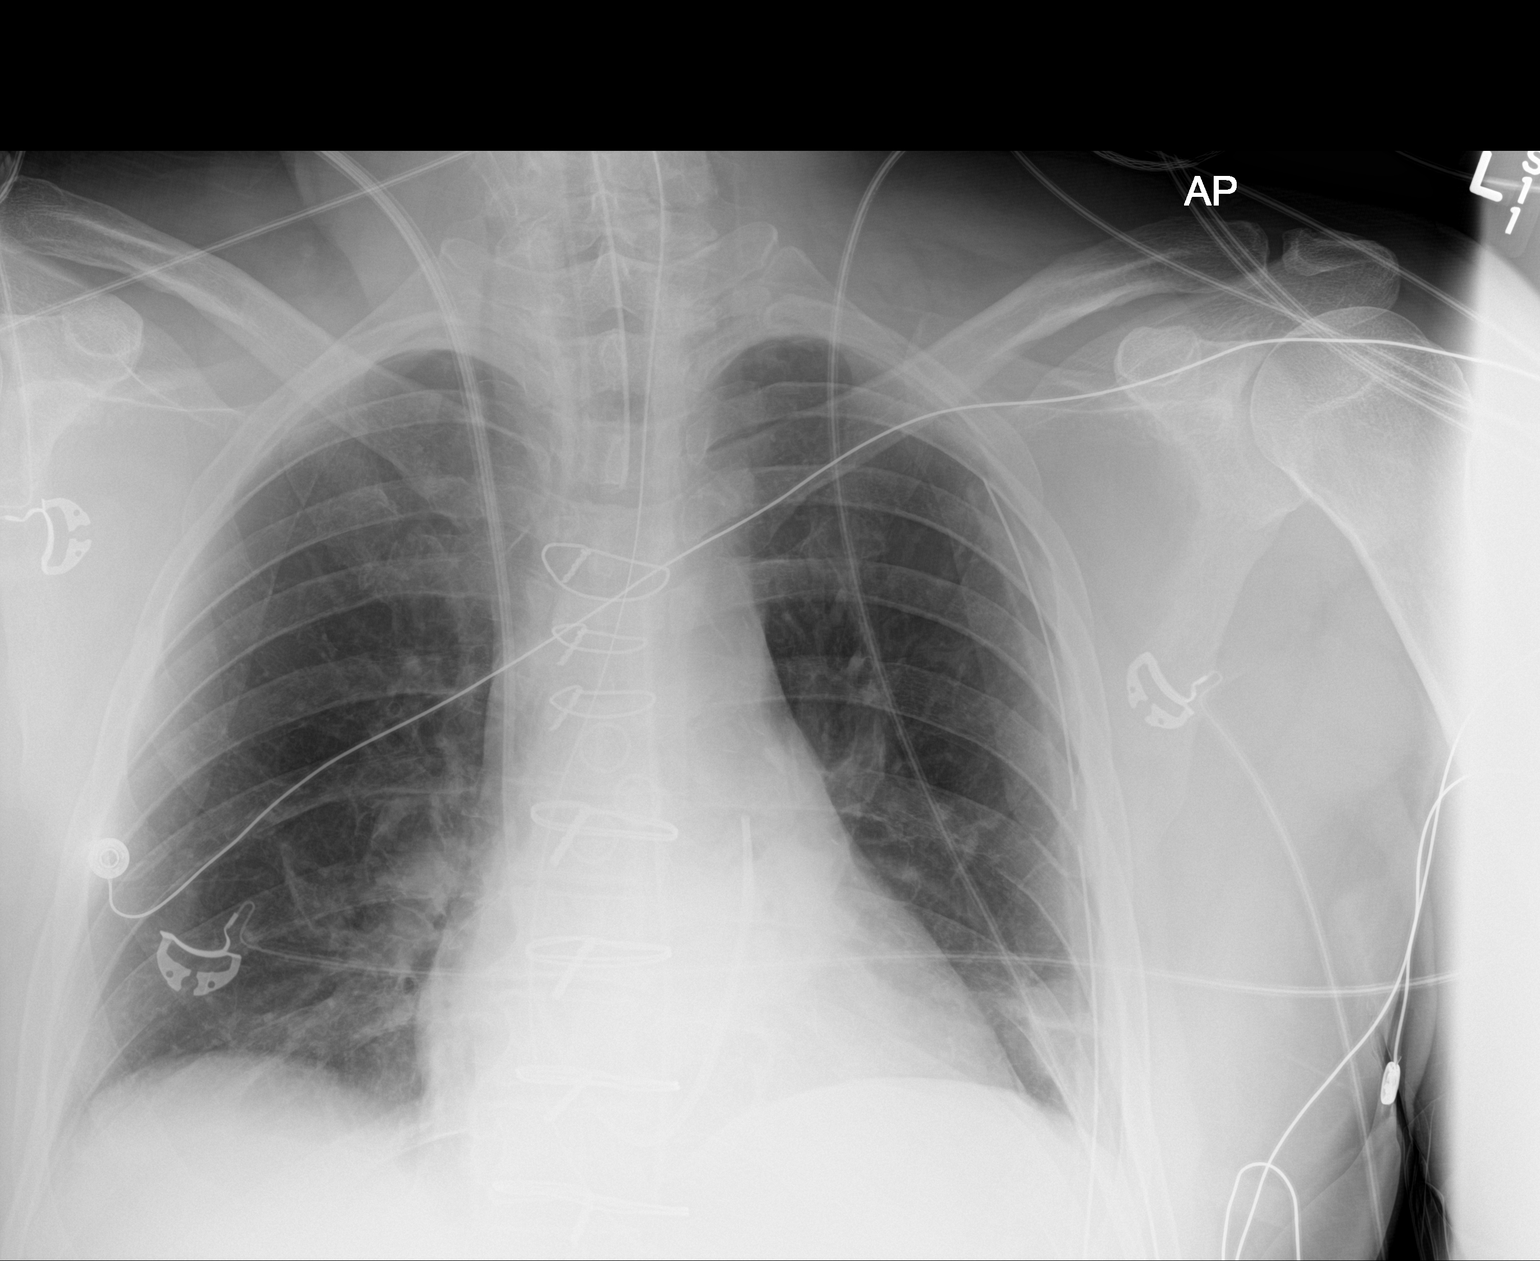

[1 of 1 positions shown; findings below may reference images not displayed]

FINDINGS: Endotracheal tube is in place with the tip well above the carina at
the level of the clavicular heads. NG tube courses into the stomach
and below the inferior margin of the film. Right IJ approach
Swan-Ganz catheter tip is in the distal pulmonary outflow tract.
Mediastinal drain and left chest tube are seen. Mild bibasilar
atelectasis is identified. No pneumothorax. Heart size is normal.
IMPRESSION: Support apparatus as described. Negative for pneumothorax with a
chest tube in place.

Subsegmental bibasilar atelectasis.

## 2018-04-04 IMAGING — DX DG CHEST 1V PORT
1 series · 1 of 1 positions shown · non-contrast
Comparison: One-view chest x-ray 01/11/2017

CLINICAL DATA: Pneumothorax.

EXAM:
PORTABLE CHEST 1 VIEW

[chest ap]
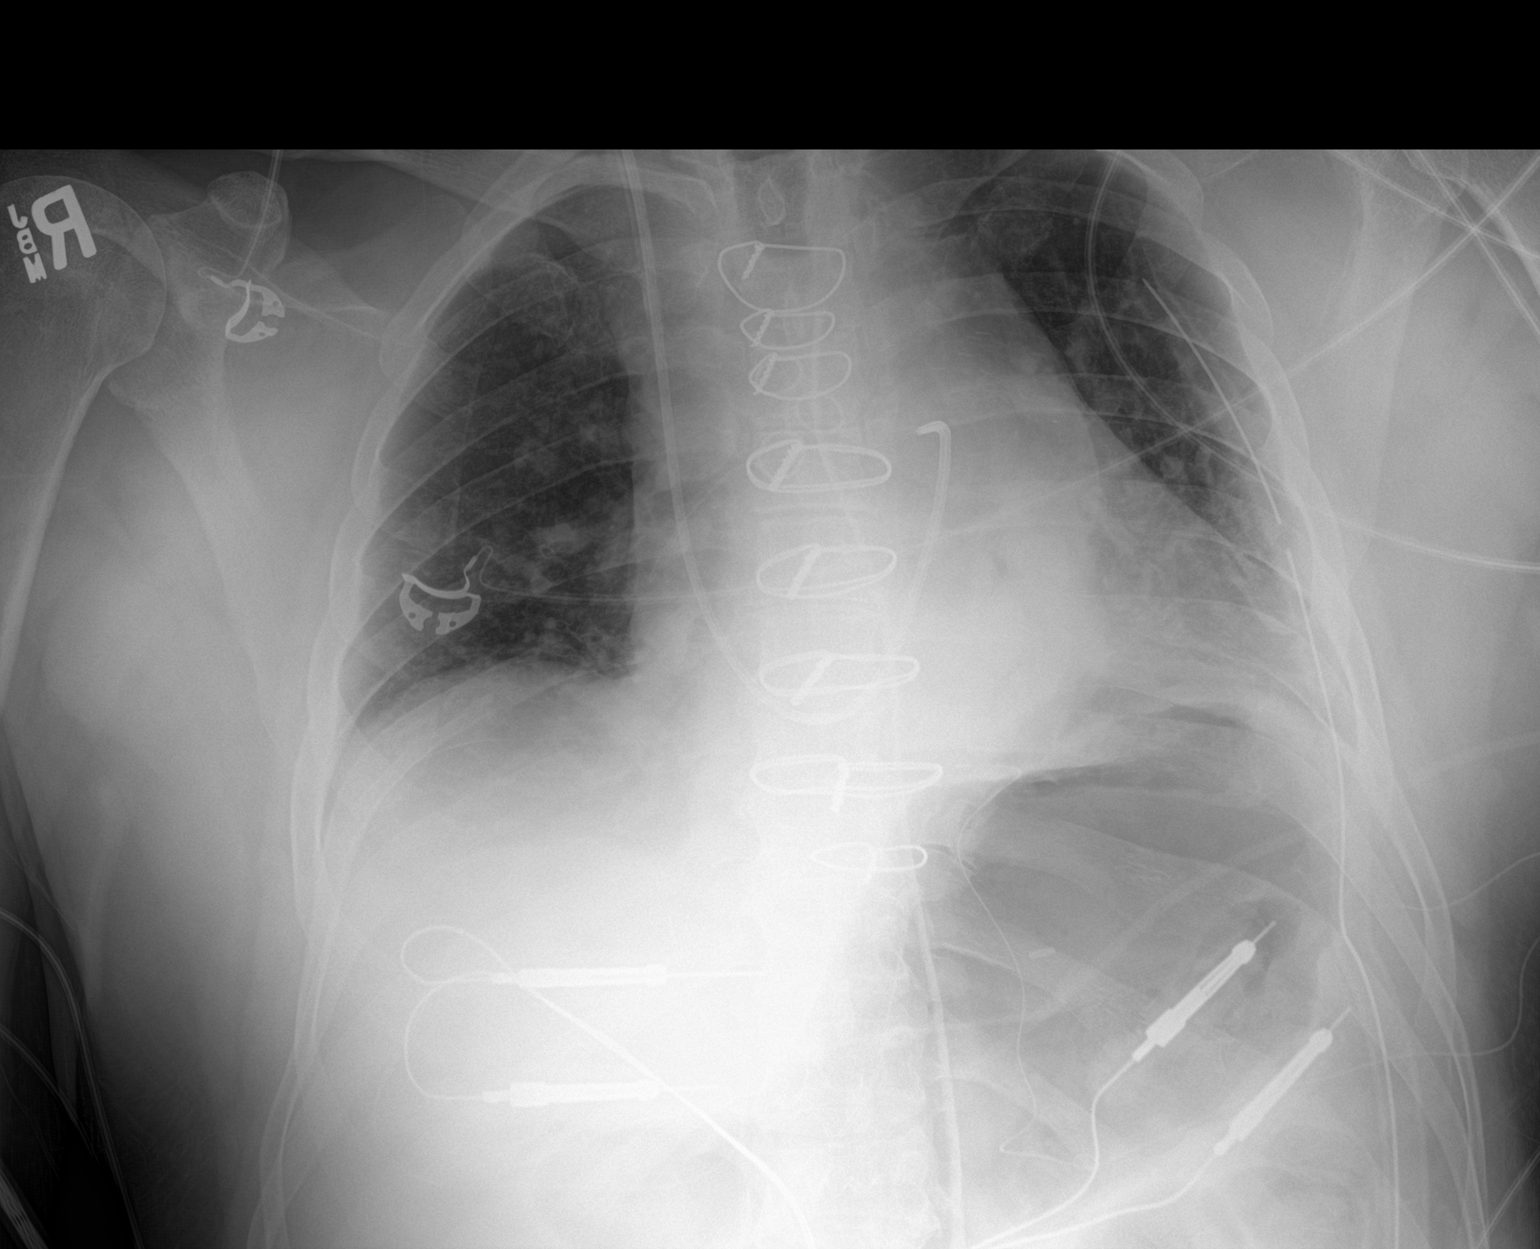

[1 of 1 positions shown; findings below may reference images not displayed]

FINDINGS: The patient has been extubated. The NG tube was removed. The heart
remains enlarged. Lung volumes are low. Bibasilar atelectasis is
present. A mediastinal drain and left-sided chest tube are in place.
There is no significant pneumothorax. The tip of the Swan-Ganz
catheter is in the main pulmonary outflow tract. External pacing
wires remain.
IMPRESSION: 1. Interval extubation and removal of NG tube.
2. Persistent low lung volumes.
3. Support apparatus is otherwise stable.
4. No significant pneumothorax or pneumomediastinum.

## 2018-04-21 ENCOUNTER — Other Ambulatory Visit: Payer: Self-pay | Admitting: Cardiology

## 2018-05-07 IMAGING — CR DG CHEST 2V
2 series · 2 of 2 positions shown · non-contrast
Comparison: 01/15/2017

CLINICAL DATA: CABG with cardioversion

EXAM:
CHEST  2 VIEW

[w chest pa]
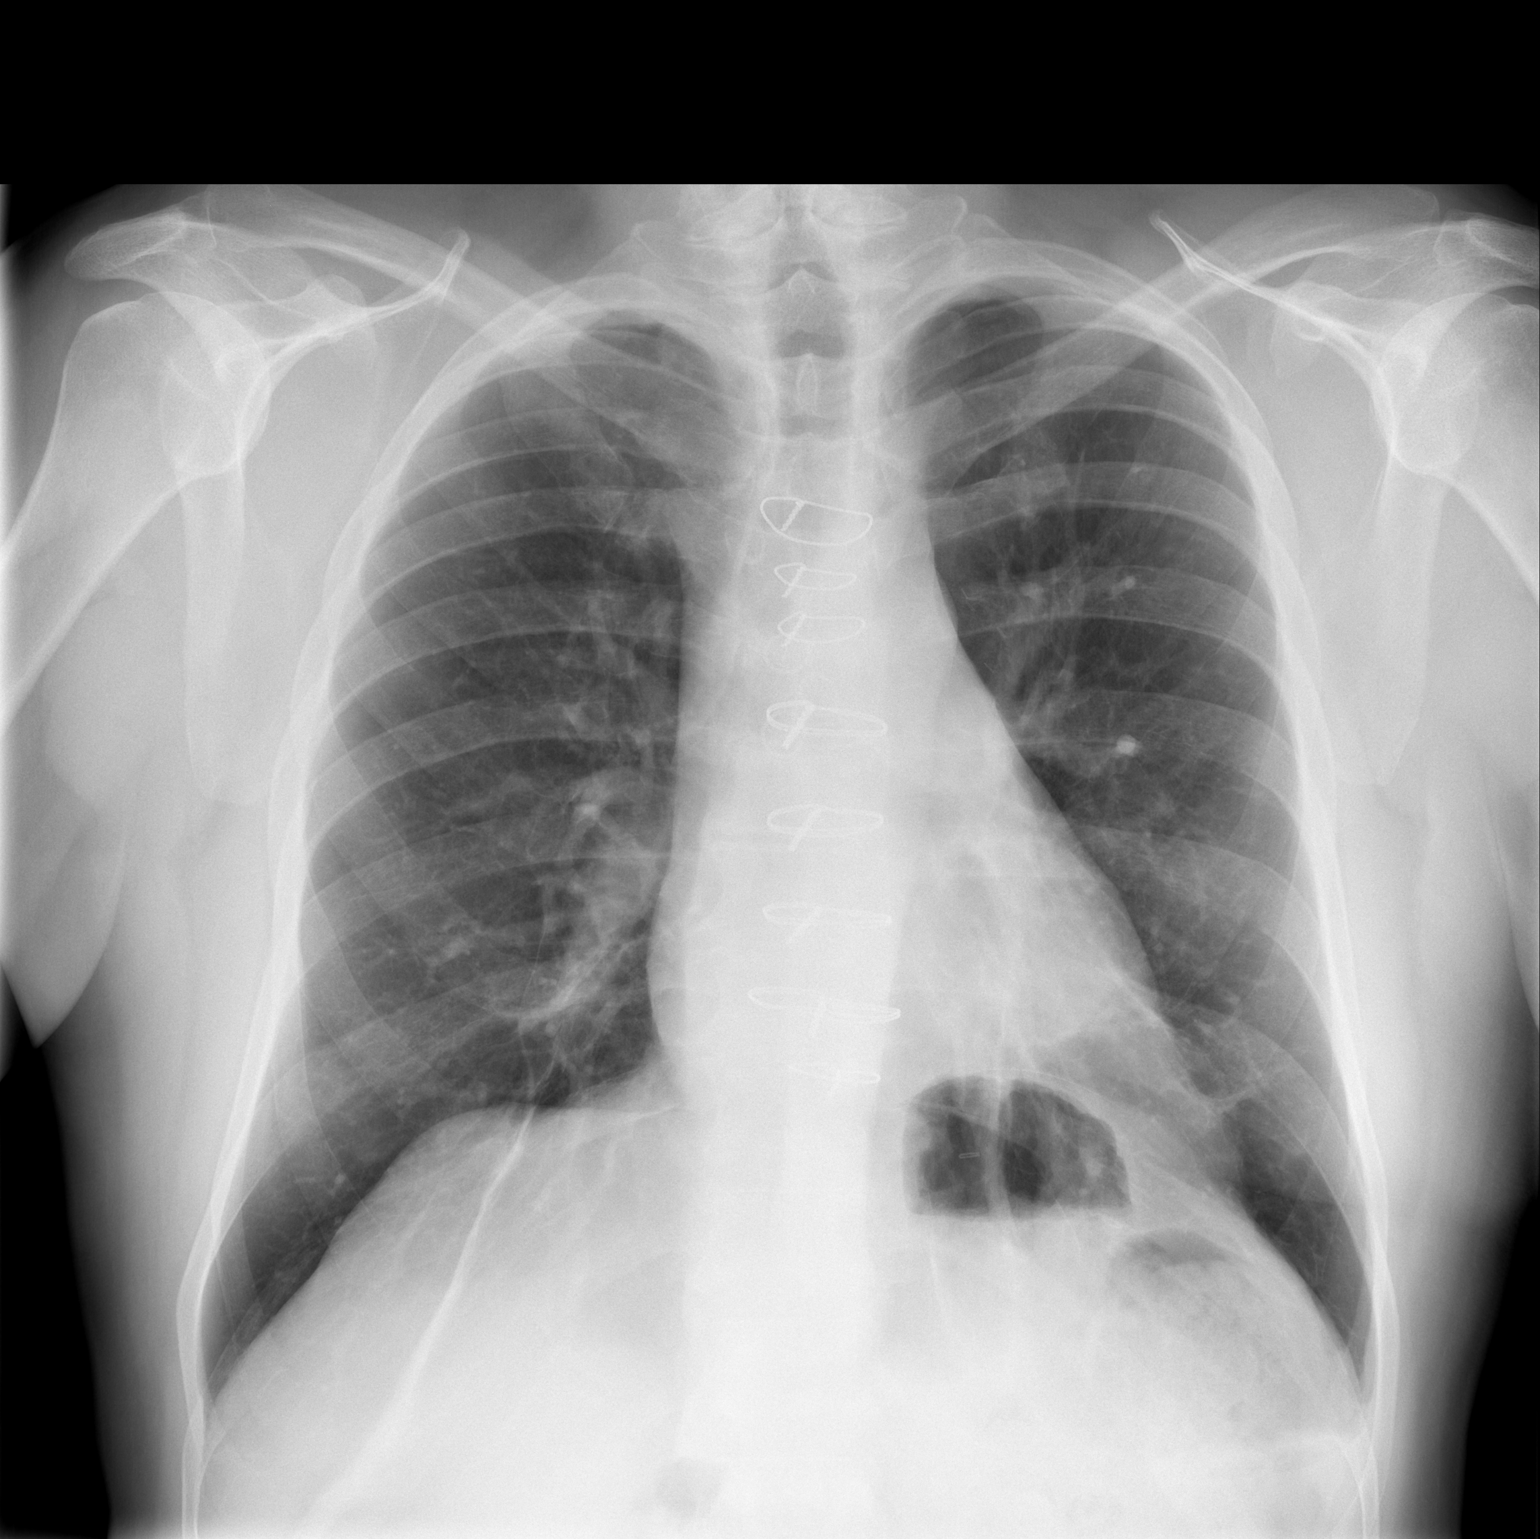

[w chest lat]
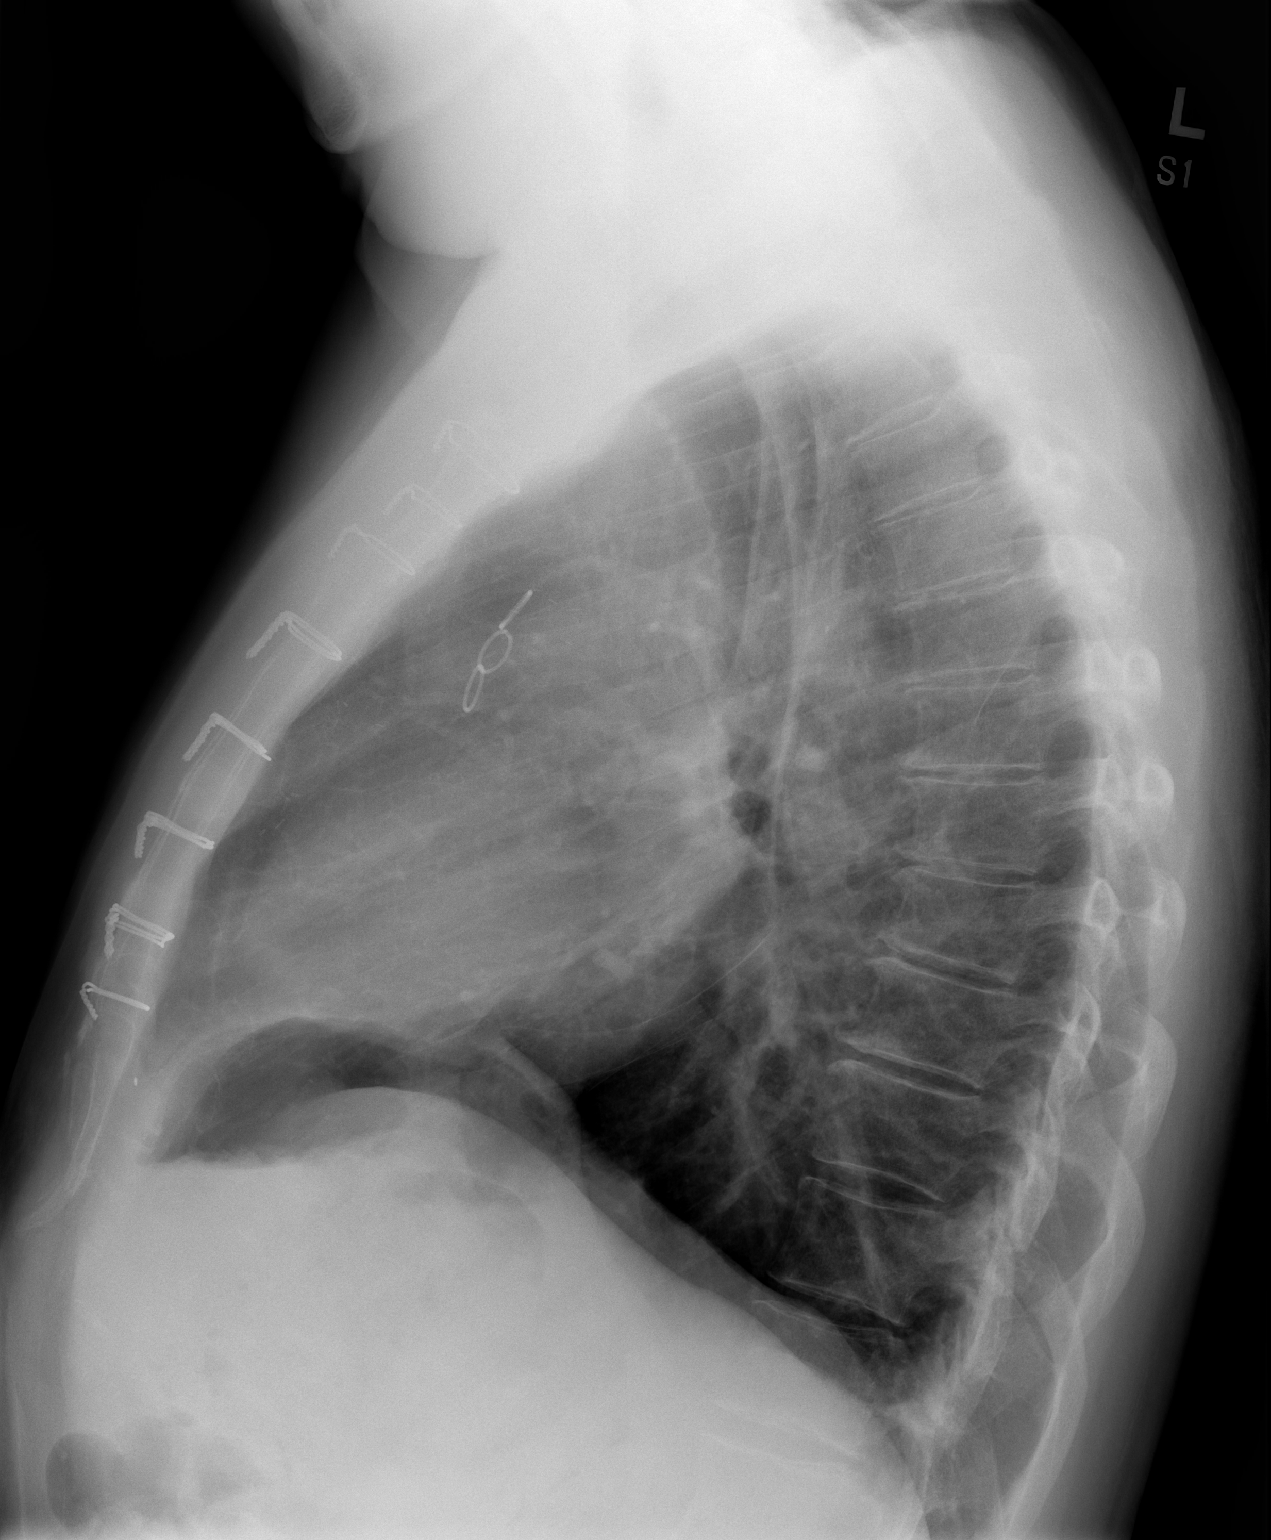

[2 of 2 positions shown; findings below may reference images not displayed]

FINDINGS: Sternotomy wires overlie normal cardiac silhouette. No appreciable
pneumothorax. No pleural fluid or edema.
IMPRESSION: No complicating features.  No pneumothorax.

## 2018-10-31 ENCOUNTER — Other Ambulatory Visit: Payer: Self-pay | Admitting: Cardiology
